# Patient Record
Sex: Female | Born: 1970 | Race: White | Hispanic: No | Marital: Single | Smoking: Former smoker
Health system: Southern US, Community
[De-identification: ages and names within clinical notes are randomized; demographics above are authoritative.]

## PROBLEM LIST (undated history)

## (undated) DIAGNOSIS — K2990 Gastroduodenitis, unspecified, without bleeding: Secondary | ICD-10-CM

## (undated) DIAGNOSIS — F32A Depression, unspecified: Secondary | ICD-10-CM

## (undated) DIAGNOSIS — B1920 Unspecified viral hepatitis C without hepatic coma: Secondary | ICD-10-CM

## (undated) DIAGNOSIS — F419 Anxiety disorder, unspecified: Secondary | ICD-10-CM

## (undated) DIAGNOSIS — F319 Bipolar disorder, unspecified: Secondary | ICD-10-CM

## (undated) DIAGNOSIS — F329 Major depressive disorder, single episode, unspecified: Secondary | ICD-10-CM

## (undated) DIAGNOSIS — F909 Attention-deficit hyperactivity disorder, unspecified type: Secondary | ICD-10-CM

## (undated) HISTORY — PX: CYST REMOVAL HAND: SHX6279

## (undated) HISTORY — PX: BREAST BIOPSY: SHX20

---

## 2001-06-10 ENCOUNTER — Inpatient Hospital Stay (HOSPITAL_COMMUNITY): Admission: EM | Admit: 2001-06-10 | Discharge: 2001-06-15 | Payer: Self-pay | Admitting: Psychiatry

## 2002-02-18 ENCOUNTER — Encounter: Admission: RE | Admit: 2002-02-18 | Discharge: 2002-02-18 | Payer: Self-pay | Admitting: Internal Medicine

## 2002-02-22 ENCOUNTER — Encounter: Admission: RE | Admit: 2002-02-22 | Discharge: 2002-02-22 | Payer: Self-pay | Admitting: Internal Medicine

## 2004-08-13 ENCOUNTER — Encounter: Admission: RE | Admit: 2004-08-13 | Discharge: 2004-08-13 | Payer: Self-pay | Admitting: *Deleted

## 2006-02-24 ENCOUNTER — Encounter: Admission: RE | Admit: 2006-02-24 | Discharge: 2006-02-24 | Payer: Self-pay | Admitting: *Deleted

## 2014-06-19 ENCOUNTER — Encounter (HOSPITAL_COMMUNITY): Payer: Self-pay | Admitting: Vascular Surgery

## 2014-06-19 ENCOUNTER — Emergency Department (HOSPITAL_COMMUNITY)
Admission: EM | Admit: 2014-06-19 | Discharge: 2014-06-19 | Disposition: A | Discharge status: Home / Self Care | Payer: Medicare Other | Attending: Emergency Medicine | Admitting: Emergency Medicine

## 2014-06-19 DIAGNOSIS — Z8619 Personal history of other infectious and parasitic diseases: Secondary | ICD-10-CM | POA: Insufficient documentation

## 2014-06-19 DIAGNOSIS — M542 Cervicalgia: Secondary | ICD-10-CM | POA: Insufficient documentation

## 2014-06-19 DIAGNOSIS — Z8719 Personal history of other diseases of the digestive system: Secondary | ICD-10-CM | POA: Diagnosis not present

## 2014-06-19 DIAGNOSIS — R079 Chest pain, unspecified: Secondary | ICD-10-CM | POA: Insufficient documentation

## 2014-06-19 DIAGNOSIS — Z8659 Personal history of other mental and behavioral disorders: Secondary | ICD-10-CM | POA: Insufficient documentation

## 2014-06-19 DIAGNOSIS — M79621 Pain in right upper arm: Secondary | ICD-10-CM | POA: Insufficient documentation

## 2014-06-19 DIAGNOSIS — Z87891 Personal history of nicotine dependence: Secondary | ICD-10-CM | POA: Diagnosis not present

## 2014-06-19 DIAGNOSIS — M79602 Pain in left arm: Secondary | ICD-10-CM

## 2014-06-19 HISTORY — DX: Gastroduodenitis, unspecified, without bleeding: K29.90

## 2014-06-19 HISTORY — DX: Depression, unspecified: F32.A

## 2014-06-19 HISTORY — DX: Attention-deficit hyperactivity disorder, unspecified type: F90.9

## 2014-06-19 HISTORY — DX: Unspecified viral hepatitis C without hepatic coma: B19.20

## 2014-06-19 HISTORY — DX: Bipolar disorder, unspecified: F31.9

## 2014-06-19 HISTORY — DX: Anxiety disorder, unspecified: F41.9

## 2014-06-19 HISTORY — DX: Major depressive disorder, single episode, unspecified: F32.9

## 2014-06-19 LAB — I-STAT TROPONIN, ED: Troponin i, poc: 0 ng/mL (ref 0.00–0.08)

## 2014-06-19 LAB — BASIC METABOLIC PANEL
Anion gap: 7 (ref 5–15)
BUN: 7 mg/dL (ref 6–23)
CO2: 27 mmol/L (ref 19–32)
CREATININE: 0.85 mg/dL (ref 0.50–1.10)
Calcium: 9 mg/dL (ref 8.4–10.5)
Chloride: 103 mmol/L (ref 96–112)
GFR calc Af Amer: >90 mL/min (ref >90)
GFR, EST NON AFRICAN AMERICAN: 83 mL/min — AB (ref >90)
GLUCOSE: 125 mg/dL — AB (ref 70–99)
POTASSIUM: 3.4 mmol/L — AB (ref 3.5–5.1)
Sodium: 137 mmol/L (ref 135–145)

## 2014-06-19 LAB — CBC
HEMATOCRIT: 39.9 % (ref 36.0–46.0)
HEMOGLOBIN: 13.6 g/dL (ref 12.0–15.0)
MCH: 31 pg (ref 26.0–34.0)
MCHC: 34.1 g/dL (ref 30.0–36.0)
MCV: 90.9 fL (ref 78.0–100.0)
Platelets: 507 10*3/uL — ABNORMAL HIGH (ref 150–400)
RBC: 4.39 MIL/uL (ref 3.87–5.11)
RDW: 13 % (ref 11.5–15.5)
WBC: 8.7 10*3/uL (ref 4.0–10.5)

## 2014-06-19 MED ORDER — HYDROCODONE-ACETAMINOPHEN 5-325 MG PO TABS: 2.0000 | ORAL_TABLET | ORAL | Status: AC | PRN

## 2014-06-19 MED ORDER — MORPHINE SULFATE 4 MG/ML IJ SOLN
4.0000 mg | Freq: Once | INTRAMUSCULAR | Status: AC
  Administered 2014-06-19: 4 mg via INTRAVENOUS
  Filled 2014-06-19: qty 1

## 2014-06-19 NOTE — ED Notes (Addendum)
Pt reports to the ED for eval of left arm pain. The pain has began on Wednesday and has been getting worse. She went to [facility] and was referred here for cardiac rule. The pain was radiating up into her left arm, shoulder, and neck. Arm is not tender to palpation. Denies any active CP. Also denies any SOB. Movement does not make the pain worse. No relieving factors. Has tried OTC meds and icing. Pt NSR on the monitor. Also the patient reports she had a significant increase in her Seroquel to 400 mg. CMS and full ROM intact. She describes the pain as a tightness. She received 324 of ASA and 1 nitro PTA. Pt A&Ox4, resp e/u, and skin warm and dry.

## 2014-06-19 NOTE — ED Provider Notes (Signed)
Patient seen/examined in the Emergency Department in conjunction with Resident Physician Provider Proulx Patient reports left arm pain and tingling in left hand.  Denies weakness and denies hand discoloration.  She mentions left sided neck pain that just started Exam : awake/alert, maex4, Equal power (5/5) with hand grip, wrist flex/extension, elbow flex/extension, and equal power with shoulder abduction/adduction.  No focal sensory deficit to light touch is noted in either UE.   Equal (2+) biceps/brachioradialisreflex in bilateral UE Plan: possible cervical radiculopathy without weakness.  Advised outpatient followup   Ripley Fraise, MD 06/19/14 [phone]

## 2014-06-19 NOTE — ED Provider Notes (Signed)
CSN: [id number]     Arrival date & time 06/19/14  1744 History   First MD Initiated Contact with Patient 06/19/14 1812     Chief Complaint  Patient presents with  . Arm Pain     (Consider location/radiation/quality/duration/timing/severity/associated sxs/prior Treatment) Patient is a 44 y.o. female presenting with arm pain. The history is provided by the patient.  Arm Pain This is a new problem. The current episode started in the past 7 days. The problem occurs constantly. The problem has been gradually worsening. Associated symptoms include chest pain (mild left upper chest with palpation.  no pain w/o palpation), myalgias and neck pain. Pertinent negatives include no abdominal pain, arthralgias, chills, coughing, fatigue, fever, nausea, numbness, rash, vomiting or weakness. Nothing aggravates the symptoms. She has tried relaxation for the symptoms. The treatment provided no relief.    Past Medical History  Diagnosis Date  . Anxiety   . ADHD (attention deficit hyperactivity disorder)   . Bipolar 1 disorder   . Depressive disorder   . Viral hepatitis C   . Gastroduodenitis    Past Surgical History  Procedure Laterality Date  . Cyst removal hand    . Breast biopsy Left    History reviewed. No pertinent family history. History  Substance Use Topics  . Smoking status: Former Smoker    Types: Cigarettes  . Smokeless tobacco: Never Used  . Alcohol Use: No   OB History    No data available     Review of Systems  Constitutional: Negative for fever, chills and fatigue.  HENT: Negative for nosebleeds.   Eyes: Negative for visual disturbance.  Respiratory: Negative for cough and shortness of breath.   Cardiovascular: Positive for chest pain (mild left upper chest with palpation.  no pain w/o palpation).  Gastrointestinal: Negative for nausea, vomiting, abdominal pain, diarrhea and constipation.  Genitourinary: Negative for dysuria.  Musculoskeletal: Positive for myalgias and  neck pain. Negative for arthralgias.  Skin: Negative for rash.  Neurological: Negative for weakness and numbness.  All other systems reviewed and are negative.     Allergies  Review of patient's allergies indicates not on file.  Home Medications   Prior to Admission medications   Not on File   BP 125/85 mmHg  Pulse 102  Temp(Src) 98.3 F (36.8 C) (Oral)  Resp 18  Ht 5\' 4"  (1.626 m)  Wt 181 lb (82.101 kg)  BMI 31.05 kg/m2  SpO2 100% Physical Exam  Constitutional: She is oriented to person, place, and time. No distress.  HENT:  Head: Normocephalic and atraumatic.  Eyes: EOM are normal. Pupils are equal, round, and reactive to light.  Neck: Normal range of motion. Neck supple.  Cardiovascular: Normal rate and intact distal pulses.   Pulmonary/Chest: No respiratory distress.  Abdominal: Soft. There is no tenderness.  Musculoskeletal: Normal range of motion.  Normal strength in the bilateral upper ext.  Normal sensation in the left hand.  The left hand is warm.  The pain in the left arm is reproducible to palpation but is very mild.  Neurological: She is alert and oriented to person, place, and time.  Skin: No rash noted. She is not diaphoretic.  Psychiatric: She has a normal mood and affect.   Cranial Nerves  II:  Visual fields Intact, pupillary equal round and reactive to light III, IV, VI: Full eye movement without nystagmus  V Facial Sensation: Normal. No weakness of masticatory muscles  VII: No facial weakness or asymmetry  VIII Auditory Acuity:  Grossly normal  IX/X: The uvula is midline; the palate elevates symmetrically  XI: Normal sternocleidomastoid and trapezius strength  XII: The tongue is midline. No atrophy or fasciculations.  Motor System: Muscle Strength: 5/5 and symmetric in the upper and lower extremities. No pronation or drift.  Muscle Tone: Tone and muscle bulk are normal in the upper and lower extremities.  Reflexes: DTRs: 2+ and symmetrical in all  four extremities.  Coordination: Intact finger-to-nose Sensation: Intact to light touch.  Gait: Routine and tandem gait are normal     ED Course  Procedures (including critical care time) Labs Review Labs Reviewed  Ruth, ED    Imaging Review No results found.   EKG Interpretation None      MDM   Final diagnoses:  None    44 y/o F w/ mult psychiatric dx but no other sig PMH presents with a few days of L upper extremity pain which has moved up the arm to the shoulder.  She has difficulty describing the pain and there are no allevating factors.  I am able to reproduce the pain on exam.  She also notes a little mild L upper chest pain that she only has with palpation and which she describes as muscle soreness.  If you don't push on the chest, she has no pain.  Exam as above, she has a borderline HR at 99.  She is aferbile, good bp, satting well on room air.  There is no chest pain or shortness of breath at rest.  Doubt PE.  She is also satting 100% on RA. Doubt ACS given no ACS risk factors, and that this would be a very atypical presentation.  Did obtain EKG which showed no st segment changes or t-wave inversions.  One trop obtained per nurse driven protocol was undectectable.  Doubt ACS  There seems to be no evidence of infection in the left arm, no warmth, no fullness, no swelling.  No concern for DVT.  Patient does endorse some lateral neck pain, but no midline neck pain, no injuries.  Possibly she has radiculopathy but she has normal strength throughout the left arm so doubt any sig spinal cord compression and feel no need for imaging at this time.  Patient has a strong left radial pulse, the hand is warm, doubt ischemia.  At this time, feels safe for opt follow up.  I have discussed the results, Dx and Tx plan with the patient. They expressed understanding and agree with the plan and were told to return to ED with any worsening of  condition or concern.    Disposition: Discharge  Condition: Good  Discharge Medication List as of 06/19/2014  8:02 PM    START taking these medications   Details  HYDROcodone-acetaminophen (NORCO/VICODIN) 5-325 MG per tablet Take 2 tablets by mouth every 4 (four) hours as needed., Starting 06/19/2014, Until Discontinued, Print        Follow Up: [facility] [address] [id number] [place] [address] [phone]  If symptoms worsen   Pt seen in conjunction with Dr. Prescott Gum, MD 06/20/14 0626  Ripley Fraise, MD 06/20/14 2231

## 2014-06-19 NOTE — Discharge Instructions (Signed)

## 2014-11-11 ENCOUNTER — Emergency Department (HOSPITAL_COMMUNITY)
Admission: EM | Admit: 2014-11-11 | Discharge: 2014-11-11 | Disposition: A | Discharge status: Home / Self Care | Payer: Medicare Other | Attending: Emergency Medicine | Admitting: Emergency Medicine

## 2014-11-11 ENCOUNTER — Encounter (HOSPITAL_COMMUNITY): Payer: Self-pay | Admitting: Emergency Medicine

## 2014-11-11 DIAGNOSIS — Z87891 Personal history of nicotine dependence: Secondary | ICD-10-CM | POA: Insufficient documentation

## 2014-11-11 DIAGNOSIS — R110 Nausea: Secondary | ICD-10-CM | POA: Diagnosis not present

## 2014-11-11 DIAGNOSIS — Z8659 Personal history of other mental and behavioral disorders: Secondary | ICD-10-CM | POA: Diagnosis not present

## 2014-11-11 DIAGNOSIS — R42 Dizziness and giddiness: Secondary | ICD-10-CM | POA: Insufficient documentation

## 2014-11-11 LAB — CBC WITH DIFFERENTIAL/PLATELET
BASOS PCT: 0 % (ref 0–1)
Basophils Absolute: 0 10*3/uL (ref 0.0–0.1)
Eosinophils Absolute: 0.1 10*3/uL (ref 0.0–0.7)
Eosinophils Relative: 2 % (ref 0–5)
HEMATOCRIT: 41.9 % (ref 36.0–46.0)
HEMOGLOBIN: 14 g/dL (ref 12.0–15.0)
LYMPHS PCT: 41 % (ref 12–46)
Lymphs Abs: 2.8 10*3/uL (ref 0.7–4.0)
MCH: 31.2 pg (ref 26.0–34.0)
MCHC: 33.4 g/dL (ref 30.0–36.0)
MCV: 93.3 fL (ref 78.0–100.0)
Monocytes Absolute: 0.5 10*3/uL (ref 0.1–1.0)
Monocytes Relative: 8 % (ref 3–12)
NEUTROS ABS: 3.4 10*3/uL (ref 1.7–7.7)
Neutrophils Relative %: 50 % (ref 43–77)
Platelets: 430 10*3/uL — ABNORMAL HIGH (ref 150–400)
RBC: 4.49 MIL/uL (ref 3.87–5.11)
RDW: 12.8 % (ref 11.5–15.5)
WBC: 6.9 10*3/uL (ref 4.0–10.5)

## 2014-11-11 LAB — URINALYSIS, ROUTINE W REFLEX MICROSCOPIC
Bilirubin Urine: NEGATIVE
Glucose, UA: NEGATIVE mg/dL
HGB URINE DIPSTICK: NEGATIVE
Ketones, ur: NEGATIVE mg/dL
Nitrite: NEGATIVE
PH: 7 (ref 5.0–8.0)
PROTEIN: NEGATIVE mg/dL
Specific Gravity, Urine: 1.013 (ref 1.005–1.030)
UROBILINOGEN UA: 1 mg/dL (ref 0.0–1.0)

## 2014-11-11 LAB — COMPREHENSIVE METABOLIC PANEL
ALT: 62 U/L — ABNORMAL HIGH (ref 14–54)
AST: 40 U/L (ref 15–41)
Albumin: 3.7 g/dL (ref 3.5–5.0)
Alkaline Phosphatase: 64 U/L (ref 38–126)
Anion gap: 7 (ref 5–15)
BUN: 7 mg/dL (ref 6–20)
CO2: 25 mmol/L (ref 22–32)
CREATININE: 0.93 mg/dL (ref 0.44–1.00)
Calcium: 8.6 mg/dL — ABNORMAL LOW (ref 8.9–10.3)
Chloride: 102 mmol/L (ref 101–111)
GFR calc Af Amer: >60 mL/min (ref >60)
GFR calc non Af Amer: >60 mL/min (ref >60)
GLUCOSE: 148 mg/dL — AB (ref 65–99)
POTASSIUM: 4.1 mmol/L (ref 3.5–5.1)
SODIUM: 134 mmol/L — AB (ref 135–145)
TOTAL PROTEIN: 7.2 g/dL (ref 6.5–8.1)
Total Bilirubin: 0.2 mg/dL — ABNORMAL LOW (ref 0.3–1.2)

## 2014-11-11 LAB — URINE MICROSCOPIC-ADD ON

## 2014-11-11 LAB — LIPASE, BLOOD: LIPASE: 18 U/L — AB (ref 22–51)

## 2014-11-11 LAB — CARBAMAZEPINE LEVEL, TOTAL: Carbamazepine Lvl: 11.5 ug/mL (ref 4.0–12.0)

## 2014-11-11 MED ORDER — LORAZEPAM 2 MG/ML IJ SOLN
1.0000 mg | Freq: Once | INTRAMUSCULAR | Status: AC
  Administered 2014-11-11: 1 mg via INTRAVENOUS
  Filled 2014-11-11: qty 1

## 2014-11-11 MED ORDER — ONDANSETRON HCL 4 MG/2ML IJ SOLN
4.0000 mg | Freq: Once | INTRAMUSCULAR | Status: AC
  Administered 2014-11-11: 4 mg via INTRAVENOUS
  Filled 2014-11-11: qty 2

## 2014-11-11 MED ORDER — SODIUM CHLORIDE 0.9 % IV BOLUS (SEPSIS)
1000.0000 mL | Freq: Once | INTRAVENOUS | Status: AC
Start: 2014-11-11 — End: 2014-11-11
  Administered 2014-11-11: 1000 mL via INTRAVENOUS

## 2014-11-11 NOTE — ED Notes (Signed)
Pt sts had increase in one of her meds and woke up this am with nausea, dizziness and slurred speech; speech noted to be clear at present; pt sts took meds last night

## 2014-11-11 NOTE — ED Provider Notes (Signed)
CSN: [id number]     Arrival date & time 11/11/14  1126 History   First MD Initiated Contact with Patient 11/11/14 1214     Chief Complaint  Patient presents with  . Nausea  . Dizziness     (Consider location/radiation/quality/duration/timing/severity/associated sxs/prior Treatment) HPI Comments: Patient presents to the emergency department with chief complaints of dizziness, nausea, vomiting, confusion, and some slurred speech. She states that the symptoms started last night and have persisted until today. She states that she takes medication for anxiety, ADHD, bipolar, and headaches. She states that she has taken her medications as directed. She has not taken anything additional. She states that she took 600 mg of extended release carbamazepine last night as well as 400 mg of immediate release carbamazepine. 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 She denies any mechanism of injury. There are no aggravating or alleviating factors.  The history is provided by the patient. No language interpreter was used.    Past Medical History  Diagnosis Date  . Anxiety   . ADHD (attention deficit hyperactivity disorder)   . Bipolar 1 disorder   . Depressive disorder   . Viral hepatitis C   . Gastroduodenitis    Past Surgical History  Procedure Laterality Date  . Cyst removal hand    . Breast biopsy Left    History reviewed. No pertinent family history. History  Substance Use Topics  . Smoking status: Former Smoker    Types: Cigarettes  . Smokeless tobacco: Never Used  . Alcohol Use: No   OB History    No data available     Review of Systems  Constitutional: Negative for fever and chills.       Intermittent confusion  Respiratory: Negative for shortness of breath.   Cardiovascular: Negative for chest pain.  Gastrointestinal: Negative for nausea, vomiting, diarrhea and constipation.  Genitourinary: Negative for dysuria.  Neurological:  Positive for dizziness.  All other systems reviewed and are negative.     Allergies  Review of patient's allergies indicates no known allergies.  Home Medications   Prior to Admission medications   Medication Sig Start Date End Date Taking? Authorizing Provider  HYDROcodone-acetaminophen (NORCO/VICODIN) 5-325 MG per tablet Take 2 tablets by mouth every 4 (four) hours as needed. 06/19/14   [name], MD   BP 143/93 mmHg  Pulse 117  Temp(Src) 97.3 F (36.3 C) (Oral)  Resp 18  SpO2 98% Physical Exam  Constitutional: She is oriented to person, place, and time. She appears well-developed and well-nourished.  HENT:  Head: Normocephalic and atraumatic.  Eyes: Conjunctivae and EOM are normal. Pupils are equal, round, and reactive to light.  Neck: Normal range of motion. Neck supple.  Cardiovascular: Normal rate and regular rhythm.  Exam reveals no gallop and no friction rub.   No murmur heard. Pulmonary/Chest: Effort normal and breath sounds normal. No respiratory distress. She has no wheezes. She has no rales. She exhibits no tenderness.  Abdominal: Soft. Bowel sounds are normal. She exhibits no distension and no mass. There is no tenderness. There is no rebound and no guarding.  Musculoskeletal: Normal range of motion. She exhibits no edema or tenderness.  Neurological: She is alert and oriented to person, place, and time.  CN III-12 intact, normal sensation and strength throughout, speech is clear, movements are goal oriented  Skin: Skin is warm and dry.  Psychiatric: She has a normal mood and affect. Her behavior is normal. Judgment  and thought content normal.  Nursing note and vitals reviewed.   ED Course  Procedures (including critical care time) Labs Review Labs Reviewed  LIPASE, BLOOD - Abnormal; Notable for the following:    Lipase 18 (*)    All other components within normal limits  COMPREHENSIVE METABOLIC PANEL - Abnormal; Notable for the following:    Sodium 134  (*)    Glucose, Bld 148 (*)    Calcium 8.6 (*)    ALT 62 (*)    Total Bilirubin 0.2 (*)    All other components within normal limits  CBC WITH DIFFERENTIAL/PLATELET - Abnormal; Notable for the following:    Platelets 430 (*)    All other components within normal limits  URINALYSIS, ROUTINE W REFLEX MICROSCOPIC (NOT AT [facility])    Imaging Review No results found.   EKG Interpretation None      MDM   Final diagnoses:  Nausea  Dizziness   Patient with symptoms consisting of nausea, vomiting, dizziness, and some confusion. I'm mainly concerned about carbamazepine toxicity, and will check a room is pain level. Differential also consists of vertigo, overdose, or less likely central vertigo.  Patient states that she is feeling much better.  Labs are reassuring.  Patient requesting to be discharged.  Patient will follow-up with her doctor today.  She is stable and ready for discharge.    Montine Circle, PA-C 11/11/14 [place], MD 11/12/14 [phone]

## 2014-11-11 NOTE — ED Notes (Signed)
PA at bedside.

## 2014-11-11 NOTE — Discharge Instructions (Signed)
Dizziness °Dizziness is a common problem. It is a feeling of unsteadiness or light-headedness. You may feel like you are about to faint. Dizziness can lead to injury if you stumble or fall. A person of any age group can suffer from dizziness, but dizziness is more common in older adults. °CAUSES  °Dizziness can be caused by many different things, including: °· Middle ear problems. °· Standing for too long. °· Infections. °· An allergic reaction. °· Aging. °· An emotional response to something, such as the sight of blood. °· Side effects of medicines. °· Tiredness. °· Problems with circulation or blood pressure. °· Excessive use of alcohol or medicines, or illegal drug use. °· Breathing too fast (hyperventilation). °· An irregular heart rhythm (arrhythmia). °· A low red blood cell count (anemia). °· Pregnancy. °· Vomiting, diarrhea, fever, or other illnesses that cause body fluid loss (dehydration). °· Diseases or conditions such as Parkinson's disease, high blood pressure (hypertension), diabetes, and thyroid problems. °· Exposure to extreme heat. °DIAGNOSIS  °Your health care provider will ask about your symptoms, perform a physical exam, and perform an electrocardiogram (ECG) to record the electrical activity of your heart. Your health care provider may also perform other heart or blood tests to determine the cause of your dizziness. These may include: °· Transthoracic echocardiogram (TTE). During echocardiography, sound waves are used to evaluate how blood flows through your heart. °· Transesophageal echocardiogram (TEE). °· Cardiac monitoring. This allows your health care provider to monitor your heart rate and rhythm in real time. °· Holter monitor. This is a portable device that records your heartbeat and can help diagnose heart arrhythmias. It allows your health care provider to track your heart activity for several days if needed. °· Stress tests by exercise or by giving medicine that makes the heart beat  faster. °TREATMENT  °Treatment of dizziness depends on the cause of your symptoms and can vary greatly. °HOME CARE INSTRUCTIONS  °· Drink enough fluids to keep your urine clear or pale yellow. This is especially important in very hot weather. In older adults, it is also important in cold weather. °· Take your medicine exactly as directed if your dizziness is caused by medicines. When taking blood pressure medicines, it is especially important to get up slowly. °¨ Rise slowly from chairs and steady yourself until you feel okay. °¨ In the morning, first sit up on the side of the bed. When you feel okay, stand slowly while holding onto something until you know your balance is fine. °· Move your legs often if you need to stand in one place for a long time. Tighten and relax your muscles in your legs while standing. °· Have someone stay with you for 1-2 days if dizziness continues to be a problem. Do this until you feel you are well enough to stay alone. Have the person call your health care provider if he or she notices changes in you that are concerning. °· Do not drive or use heavy machinery if you feel dizzy. °· Do not drink alcohol. °SEEK IMMEDIATE MEDICAL CARE IF:  °· Your dizziness or light-headedness gets worse. °· You feel nauseous or vomit. °· You have problems talking, walking, or using your arms, hands, or legs. °· You feel weak. °· You are not thinking clearly or you have trouble forming sentences. It may take a friend or family member to notice this. °· You have chest pain, abdominal pain, shortness of breath, or sweating. °· Your vision changes. °· You notice   any bleeding.  You have side effects from medicine that seems to be getting worse rather than better. MAKE SURE YOU:   Understand these instructions.  Will watch your condition.  Will get help right away if you are not doing well or get worse. Document Released: 09/18/2000 Document Revised: 03/30/2013 Document Reviewed: 10/12/2010 [facility]  Patient Information 2015 [place], [place]. This information is not intended to replace advice given to you by your health care provider. Make sure you discuss any questions you have with your health care provider. Nausea and Vomiting Nausea is a sick feeling that often comes before throwing up (vomiting). Vomiting is a reflex where stomach contents come out of your mouth. Vomiting can cause severe loss of body fluids (dehydration). Children and elderly adults can become dehydrated quickly, especially if they also have diarrhea. Nausea and vomiting are symptoms of a condition or disease. It is important to find the cause of your symptoms. CAUSES   Direct irritation of the stomach lining. This irritation can result from increased acid production (gastroesophageal reflux disease), infection, food poisoning, taking certain medicines (such as nonsteroidal anti-inflammatory drugs), alcohol use, or tobacco use.  Signals from the brain.These signals could be caused by a headache, heat exposure, an inner ear disturbance, increased pressure in the brain from injury, infection, a tumor, or a concussion, pain, emotional stimulus, or metabolic problems.  An obstruction in the gastrointestinal tract (bowel obstruction).  Illnesses such as diabetes, hepatitis, gallbladder problems, appendicitis, kidney problems, cancer, sepsis, atypical symptoms of a heart attack, or eating disorders.  Medical treatments such as chemotherapy and radiation.  Receiving medicine that makes you sleep (general anesthetic) during surgery. DIAGNOSIS Your caregiver may ask for tests to be done if the problems do not improve after a few days. Tests may also be done if symptoms are severe or if the reason for the nausea and vomiting is not clear. Tests may include:  Urine tests.  Blood tests.  Stool tests.  Cultures (to look for evidence of infection).  X-rays or other imaging studies. Test results can help your caregiver make  decisions about treatment or the need for additional tests. TREATMENT You need to stay well hydrated. Drink frequently but in small amounts.You may wish to drink water, sports drinks, clear broth, or eat frozen ice pops or gelatin dessert to help stay hydrated.When you eat, eating slowly may help prevent nausea.There are also some antinausea medicines that may help prevent nausea. HOME CARE INSTRUCTIONS   Take all medicine as directed by your caregiver.  If you do not have an appetite, do not force yourself to eat. However, you must continue to drink fluids.  If you have an appetite, eat a normal diet unless your caregiver tells you differently.  Eat a variety of complex carbohydrates (rice, wheat, potatoes, bread), lean meats, yogurt, fruits, and vegetables.  Avoid high-fat foods because they are more difficult to digest.  Drink enough water and fluids to keep your urine clear or pale yellow.  If you are dehydrated, ask your caregiver for specific rehydration instructions. Signs of dehydration may include:  Severe thirst.  Dry lips and mouth.  Dizziness.  Dark urine.  Decreasing urine frequency and amount.  Confusion.  Rapid breathing or pulse. SEEK IMMEDIATE MEDICAL CARE IF:   You have blood or brown flecks (like coffee grounds) in your vomit.  You have black or bloody stools.  You have a severe headache or stiff neck.  You are confused.  You have severe abdominal pain.  You have chest pain or trouble breathing.  You do not urinate at least once every 8 hours.  You develop cold or clammy skin.  You continue to vomit for longer than 24 to 48 hours.  You have a fever. MAKE SURE YOU:   Understand these instructions.  Will watch your condition.  Will get help right away if you are not doing well or get worse. Document Released: 03/25/2005 Document Revised: 06/17/2011 Document Reviewed: 08/22/2010 [facility] Patient Information 2015 [place], [place]. This  information is not intended to replace advice given to you by your health care provider. Make sure you discuss any questions you have with your health care provider.

## 2015-08-26 ENCOUNTER — Emergency Department (HOSPITAL_COMMUNITY)
Admission: EM | Admit: 2015-08-26 | Discharge: 2015-08-28 | Disposition: A | Discharge status: Home / Self Care | Payer: Medicare Other | Attending: Emergency Medicine | Admitting: Emergency Medicine

## 2015-08-26 ENCOUNTER — Encounter (HOSPITAL_COMMUNITY): Payer: Self-pay | Admitting: Emergency Medicine

## 2015-08-26 DIAGNOSIS — F32A Depression, unspecified: Secondary | ICD-10-CM | POA: Diagnosis present

## 2015-08-26 DIAGNOSIS — F332 Major depressive disorder, recurrent severe without psychotic features: Secondary | ICD-10-CM | POA: Diagnosis not present

## 2015-08-26 DIAGNOSIS — I10 Essential (primary) hypertension: Secondary | ICD-10-CM

## 2015-08-26 DIAGNOSIS — F1910 Other psychoactive substance abuse, uncomplicated: Secondary | ICD-10-CM

## 2015-08-26 DIAGNOSIS — F314 Bipolar disorder, current episode depressed, severe, without psychotic features: Secondary | ICD-10-CM

## 2015-08-26 DIAGNOSIS — F909 Attention-deficit hyperactivity disorder, unspecified type: Secondary | ICD-10-CM | POA: Diagnosis not present

## 2015-08-26 DIAGNOSIS — R739 Hyperglycemia, unspecified: Secondary | ICD-10-CM | POA: Diagnosis not present

## 2015-08-26 DIAGNOSIS — R45851 Suicidal ideations: Secondary | ICD-10-CM | POA: Diagnosis not present

## 2015-08-26 DIAGNOSIS — D473 Essential (hemorrhagic) thrombocythemia: Secondary | ICD-10-CM | POA: Insufficient documentation

## 2015-08-26 DIAGNOSIS — Z87891 Personal history of nicotine dependence: Secondary | ICD-10-CM | POA: Diagnosis not present

## 2015-08-26 DIAGNOSIS — Z79899 Other long term (current) drug therapy: Secondary | ICD-10-CM | POA: Diagnosis not present

## 2015-08-26 DIAGNOSIS — D75839 Thrombocytosis, unspecified: Secondary | ICD-10-CM

## 2015-08-26 DIAGNOSIS — F319 Bipolar disorder, unspecified: Secondary | ICD-10-CM | POA: Diagnosis not present

## 2015-08-26 LAB — CBC
HCT: 42.4 % (ref 36.0–46.0)
Hemoglobin: 14.2 g/dL (ref 12.0–15.0)
MCH: 30.7 pg (ref 26.0–34.0)
MCHC: 33.5 g/dL (ref 30.0–36.0)
MCV: 91.6 fL (ref 78.0–100.0)
PLATELETS: 476 10*3/uL — AB (ref 150–400)
RBC: 4.63 MIL/uL (ref 3.87–5.11)
RDW: 12.6 % (ref 11.5–15.5)
WBC: 9.5 10*3/uL (ref 4.0–10.5)

## 2015-08-26 LAB — COMPREHENSIVE METABOLIC PANEL
ALT: 28 U/L (ref 14–54)
AST: 25 U/L (ref 15–41)
Albumin: 4.2 g/dL (ref 3.5–5.0)
Alkaline Phosphatase: 71 U/L (ref 38–126)
Anion gap: 9 (ref 5–15)
BUN: 7 mg/dL (ref 6–20)
CHLORIDE: 104 mmol/L (ref 101–111)
CO2: 24 mmol/L (ref 22–32)
Calcium: 9.4 mg/dL (ref 8.9–10.3)
Creatinine, Ser: 1.07 mg/dL — ABNORMAL HIGH (ref 0.44–1.00)
GFR calc Af Amer: >60 mL/min (ref >60)
GFR calc non Af Amer: >60 mL/min (ref >60)
Glucose, Bld: 122 mg/dL — ABNORMAL HIGH (ref 65–99)
Potassium: 3.8 mmol/L (ref 3.5–5.1)
Sodium: 137 mmol/L (ref 135–145)
Total Bilirubin: 0.3 mg/dL (ref 0.3–1.2)
Total Protein: 7.9 g/dL (ref 6.5–8.1)

## 2015-08-26 LAB — RAPID URINE DRUG SCREEN, HOSP PERFORMED
Amphetamines: POSITIVE — AB
BENZODIAZEPINES: POSITIVE — AB
Barbiturates: NOT DETECTED
Cocaine: POSITIVE — AB
OPIATES: NOT DETECTED
Tetrahydrocannabinol: POSITIVE — AB

## 2015-08-26 LAB — ACETAMINOPHEN LEVEL: Acetaminophen (Tylenol), Serum: <10 ug/mL — ABNORMAL LOW (ref 10–30)

## 2015-08-26 LAB — ETHANOL

## 2015-08-26 LAB — SALICYLATE LEVEL: Salicylate Lvl: <4 mg/dL (ref 2.8–30.0)

## 2015-08-26 MED ORDER — PANTOPRAZOLE SODIUM 40 MG PO TBEC
80.0000 mg | DELAYED_RELEASE_TABLET | Freq: Every day | ORAL | Status: DC
  Administered 2015-08-27 – 2015-08-28 (×2): 80 mg via ORAL
  Filled 2015-08-26 (×2): qty 2

## 2015-08-26 MED ORDER — ALPRAZOLAM 1 MG PO TABS
1.0000 mg | ORAL_TABLET | Freq: Four times a day (QID) | ORAL | Status: DC
  Administered 2015-08-27 (×4): 1 mg via ORAL
  Filled 2015-08-26 (×4): qty 1

## 2015-08-26 MED ORDER — LAMOTRIGINE 100 MG PO TABS
100.0000 mg | ORAL_TABLET | Freq: Every day | ORAL | Status: DC
  Administered 2015-08-26 – 2015-08-27 (×2): 100 mg via ORAL
  Filled 2015-08-26 (×3): qty 1

## 2015-08-26 MED ORDER — IBUPROFEN 200 MG PO TABS
600.0000 mg | ORAL_TABLET | Freq: Three times a day (TID) | ORAL | Status: DC | PRN
  Administered 2015-08-27: 600 mg via ORAL
  Filled 2015-08-26: qty 3

## 2015-08-26 MED ORDER — METHYLPHENIDATE HCL 5 MG PO TABS
20.0000 mg | ORAL_TABLET | Freq: Four times a day (QID) | ORAL | Status: DC
  Administered 2015-08-27 (×4): 20 mg via ORAL
  Filled 2015-08-26 (×4): qty 4

## 2015-08-26 MED ORDER — ZOLPIDEM TARTRATE 5 MG PO TABS
5.0000 mg | ORAL_TABLET | Freq: Every evening | ORAL | Status: DC | PRN
  Administered 2015-08-26 – 2015-08-27 (×2): 5 mg via ORAL
  Filled 2015-08-26 (×2): qty 1

## 2015-08-26 MED ORDER — CLONIDINE HCL 0.1 MG PO TABS
0.2000 mg | ORAL_TABLET | Freq: Every day | ORAL | Status: DC
  Administered 2015-08-26 – 2015-08-27 (×2): 0.2 mg via ORAL
  Filled 2015-08-26 (×2): qty 2

## 2015-08-26 MED ORDER — ONDANSETRON HCL 4 MG PO TABS: 4.0000 mg | ORAL_TABLET | Freq: Three times a day (TID) | ORAL | Status: DC | PRN

## 2015-08-26 MED ORDER — LAMOTRIGINE 200 MG PO TABS
200.0000 mg | ORAL_TABLET | Freq: Every day | ORAL | Status: DC
  Administered 2015-08-27 – 2015-08-28 (×2): 200 mg via ORAL
  Filled 2015-08-26 (×2): qty 1

## 2015-08-26 MED ORDER — ACETAMINOPHEN 325 MG PO TABS: 650.0000 mg | ORAL_TABLET | ORAL | Status: DC | PRN

## 2015-08-26 MED ORDER — ALUM & MAG HYDROXIDE-SIMETH 200-200-20 MG/5ML PO SUSP: 30.0000 mL | ORAL | Status: DC | PRN

## 2015-08-26 MED ORDER — LURASIDONE HCL 40 MG PO TABS
120.0000 mg | ORAL_TABLET | Freq: Every day | ORAL | Status: DC
  Administered 2015-08-26 – 2015-08-27 (×2): 120 mg via ORAL
  Filled 2015-08-26 (×3): qty 3
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
## 2015-08-26 NOTE — ED Notes (Signed)
Pt reports SI. No specific plan at present. Hx of SI attempts. No HI, hallucinations or substance abuse.

## 2015-08-26 NOTE — ED Provider Notes (Signed)
CSN: [id number]     Arrival date & time 08/26/15  1901 History   First MD Initiated Contact with Patient 08/26/15 2103     Chief Complaint  Patient presents with  . Suicidal     (Consider location/radiation/quality/duration/timing/severity/associated sxs/prior Treatment) HPI Comments: Tara Gentry is a 45 y.o. female with a PMHx of anxiety, ADHD, bipolar 1 disorder, depression, viral HepC, and gastroduodenitis, who presents to the ED voluntarily upon her psychiatrist's recommendation, with complaints of SI with a plan to OD. She states this has been ongoing for approximately 2 months. Her psychiatrist increased her Lamictal today, but due to her suicidal ideations she advised her to seek emergent psychiatric attention. Patient is here voluntarily. She denies HI/AVH, smoking, or any alcohol use. She reports very infrequent marijuana use, last use was 3 weeks ago, and denies any other illicit drug use. She is compliant with all her psychiatric medications, last dose of her afternoon medicines was at around 2 PM. She has not had her dinnertime medications he had. Her psychiatrist is Dr. Robina Ade. She has no medical complaints today and denies any other concerns at this time. Denies possibility of pregnancy, states she's a lesbian and is also on OCPs.  Patient is a 45 y.o. female presenting with mental health disorder. The history is provided by the patient. No language interpreter was used.  Mental Health Problem Presenting symptoms: depression and suicidal thoughts   Presenting symptoms: no hallucinations and no homicidal ideas   Onset quality:  Gradual Duration:  2 months Timing:  Constant Progression:  Worsening Chronicity:  Recurrent Context: recent medication change   Treatment compliance:  All of the time Time since last psychoactive medication taken:  7 hours Relieved by:  None tried Worsened by:  Nothing tried Ineffective treatments:  None tried Associated symptoms: no abdominal pain and  no chest pain   Risk factors: hx of mental illness and hx of suicide attempts     Past Medical History  Diagnosis Date  . Anxiety   . ADHD (attention deficit hyperactivity disorder)   . Bipolar 1 disorder ([place])   . Depressive disorder   . Viral hepatitis C   . Gastroduodenitis    Past Surgical History  Procedure Laterality Date  . Cyst removal hand    . Breast biopsy Left    History reviewed. No pertinent family history. Social History  Substance Use Topics  . Smoking status: Former Smoker    Types: Cigarettes  . Smokeless tobacco: Never Used  . Alcohol Use: No   OB History    No data available     Review of Systems  Constitutional: Negative for fever and chills.  Respiratory: Negative for shortness of breath.   Cardiovascular: Negative for chest pain.  Gastrointestinal: Negative for nausea, vomiting, abdominal pain, diarrhea and constipation.  Genitourinary: Negative for dysuria and hematuria.  Musculoskeletal: Negative for myalgias and arthralgias.  Skin: Negative for color change.  Allergic/Immunologic: Negative for immunocompromised state.  Neurological: Negative for weakness and numbness.  Psychiatric/Behavioral: Positive for suicidal ideas. Negative for homicidal ideas, hallucinations and confusion.   10 Systems reviewed and are negative for acute change except as noted in the HPI.    Allergies  Review of patient's allergies indicates no known allergies.  Home Medications   Prior to Admission medications   Medication Sig Start Date End Date Taking? Authorizing Provider  ALPRAZolam Duanne Moron) 1 MG tablet Take 1 mg by mouth 4 (four) times daily.    Yes Historical Provider,  MD  cloNIDine (CATAPRES) 0.1 MG tablet Take 0.2 mg by mouth at bedtime.   Yes Historical Provider, MD  lamoTRIgine (LAMICTAL) 100 MG tablet Take 100-200 mg by mouth 2 (two) times daily. 200 mg every morning and 100 mg every night 07/25/09  Yes Historical Provider, MD  Lurasidone HCl (LATUDA)  120 MG TABS Take 120 mg by mouth daily.   Yes Historical Provider, MD  methylphenidate (RITALIN) 20 MG tablet Take 20 mg by mouth 4 (four) times daily.   Yes Historical Provider, MD  norgestimate-ethinyl estradiol (MONONESSA) 0.25-35 MG-MCG tablet Take 1 tablet by mouth daily.   Yes Historical Provider, MD  omeprazole (PRILOSEC) 20 MG capsule Take 40 mg by mouth daily.   Yes Historical Provider, MD  HYDROcodone-acetaminophen (NORCO/VICODIN) 5-325 MG per tablet Take 2 tablets by mouth every 4 (four) hours as needed. Patient not taking: Reported on 11/11/2014 06/19/14   [name], MD   BP 152/117 mmHg  Pulse 108  Temp(Src) 98.1 F (36.7 C) (Oral)  Resp 16  SpO2 98% Physical Exam  Constitutional: She is oriented to person, place, and time. Vital signs are normal. She appears well-developed and well-nourished.  Non-toxic appearance. No distress.  Afebrile, nontoxic, NAD, clutching a small stuffed animal and tearful at times  HENT:  Head: Normocephalic and atraumatic.  Mouth/Throat: Oropharynx is clear and moist and mucous membranes are normal.  Eyes: Conjunctivae and EOM are normal. Right eye exhibits no discharge. Left eye exhibits no discharge.  Neck: Normal range of motion. Neck supple.  Cardiovascular: Normal rate, regular rhythm, normal heart sounds and intact distal pulses.  Exam reveals no gallop and no friction rub.   No murmur heard. Tachycardia initially which resolved by my exam  Pulmonary/Chest: Effort normal and breath sounds normal. No respiratory distress. She has no decreased breath sounds. She has no wheezes. She has no rhonchi. She has no rales.  Abdominal: Soft. Normal appearance and bowel sounds are normal. She exhibits no distension. There is no tenderness. There is no rigidity, no rebound, no guarding, no CVA tenderness, no tenderness at McBurney's point and negative Murphy's sign.  Musculoskeletal: Normal range of motion.  Neurological: She is alert and oriented to  person, place, and time. She has normal strength. No sensory deficit.  Skin: Skin is warm, dry and intact. No rash noted.  Psychiatric: Her speech is normal. She is not actively hallucinating. She exhibits a depressed mood. She expresses suicidal ideation. She expresses no homicidal ideation. She expresses suicidal plans. She expresses no homicidal plans.  Clutching a small stuffed animal, depressed affect, tearful at times, endorsing SI with a plan to OD, denies HI/AVH  Nursing note and vitals reviewed.   ED Course  Procedures (including critical care time) Labs Review Labs Reviewed  COMPREHENSIVE METABOLIC PANEL - Abnormal; Notable for the following:    Glucose, Bld 122 (*)    Creatinine, Ser 1.07 (*)    All other components within normal limits  ACETAMINOPHEN LEVEL - Abnormal; Notable for the following:    Acetaminophen (Tylenol), Serum <10 (*)    All other components within normal limits  CBC - Abnormal; Notable for the following:    Platelets 476 (*)    All other components within normal limits  ETHANOL  SALICYLATE LEVEL  URINE RAPID DRUG SCREEN, HOSP PERFORMED    Imaging Review No results found. I have personally reviewed and evaluated these images and lab results as part of my medical decision-making.   EKG Interpretation None  MDM   Final diagnoses:  Suicidal ideation  HTN (hypertension), benign  Thrombocytosis ([place])  Borderline hyperglycemia    45 y.o. female here with suicidal ideations with a plan to OD. Hx of suicide attempts. Went to her psychiatrist who told her to come here. No HI/AVH. Rare marijuana use, no other illicit drugs. No EtOH use. Pt tearful but cooperative, here voluntarily. If she tried to leave she would need IVC papers taken out. Screening labs show chronic thrombocytosis and mild hyperglycemia similar to prior visits. UDS not yet obtained, pt states she is hungry/thirsty, will get her meal order placed so she can give [place] a urine specimen,  but she is medically cleared at this time. TTS consulted, psych hold orders placed, please see TTS notes for further documentation of care/dispo. Pt stable at this time. Home meds reordered aside from OCPs which she will get a friend to bring from home.  BP 152/117 mmHg  Pulse 108  Temp(Src) 98.1 F (36.7 C) (Oral)  Resp 16  SpO2 98%  Meds ordered this encounter  Medications  . ALPRAZolam (XANAX) tablet 1 mg    Sig:   . cloNIDine (CATAPRES) tablet 0.2 mg    Sig:   . lamoTRIgine (LAMICTAL) tablet 100-200 mg    Sig:   . Lurasidone HCl TABS 120 mg    Sig:   . methylphenidate (RITALIN) tablet 20 mg    Sig:   . pantoprazole (PROTONIX) EC tablet 80 mg    Sig:   . alum & mag hydroxide-simeth (MAALOX/MYLANTA) [id number] MG/5ML suspension 30 mL    Sig:   . ondansetron (ZOFRAN) tablet 4 mg    Sig:   . zolpidem (AMBIEN) tablet 5 mg    Sig:   . ibuprofen (ADVIL,MOTRIN) tablet 600 mg    Sig:   . acetaminophen (TYLENOL) tablet 650 mg    Sig:      Jaqwan Wieber Camprubi-Soms, PA-C 08/26/15 2125  Fredia Sorrow, MD 08/28/15 0025

## 2015-08-26 NOTE — ED Notes (Signed)
Belongings in locker 71.

## 2015-08-26 NOTE — BH Assessment (Signed)
Assessment completed. Consulted Darlyne Russian, PA-C who agrees that pt meets inpatient criteria. Informed Mercedes Camprubi-Soms, PA-C of the recommendation.

## 2015-08-27 DIAGNOSIS — R45851 Suicidal ideations: Secondary | ICD-10-CM | POA: Diagnosis not present

## 2015-08-27 DIAGNOSIS — F332 Major depressive disorder, recurrent severe without psychotic features: Secondary | ICD-10-CM | POA: Diagnosis not present

## 2015-08-27 NOTE — ED Provider Notes (Signed)
I was asked to evaluate the patient for possible IVC paperwork. The patient prefers the history that she was feeling suicidal and had thoughts of taking her medications and her life. At this time she wanted to go home. She asked me to call her psychiatrist Dr. Robina Ade who I did and I spoke to her on the phone. As it was recommended by the psychiatrist who saw the patient this morning that she stay for inpatient psychiatric help Dr. Robina Ade felt that this plan should be up held and said she could not come into the emergency department to evaluate her here today. This was relayed to the patient who was not happy with this but was cooperative. IVC paperwork was completed.  Harvel Quale, MD 08/27/15 [phone]

## 2015-08-27 NOTE — Consult Note (Signed)
[facility] Face-to-Face Psychiatry Consult   Reason for Consult:  Polysubstance intoxication, depression with suicide ideation Referring Physician:   EDP Patient Identification: Tara Gentry MRN:  [id number] Principal Diagnosis: Major depressive disorder, recurrent, severe without psychotic features [facility]) Diagnosis:   Patient Active Problem List   Diagnosis Date Noted  . Major depressive disorder, recurrent, severe without psychotic features ([place]) [F33.2] 08/27/2015    Priority: High    Total Time spent with patient: 45 minutes  Subjective:   Tara Gentry is a 45 y.o. female patient admitted with Depression, suicide ideation  HPI:  Caucasian female, 45 years old was evaluated today for suicidal ideation with plans to OD on her medications.   Patient stated when providers asked her what brought her to the hospital" Life is too much, a lot is going on and I do not want to live any more". She reports multiple previous attempts to kill  or harm herself.  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  Patient sees Dr. Toy Care, Psychiatrist who prescribes her medications.  Patient is receiving Benzodiazepine and is also using Cocaine and Marijuana.  Patient was advised by her Psychiatrist to come to the ER for evaluation.  She reports poor sleep and reports multiple previous inpatient Psychiatric hospitalizations.  She has been accepted for admission and we will be seeking placement at any facility with available bed.  Past Psychiatric History: MDD, GAD  Risk to Self: Suicidal Ideation: Yes-Currently Present Suicidal Intent: Yes-Currently Present Is patient at risk for suicide?: Yes Suicidal Plan?: Yes-Currently Present Specify Current Suicidal Plan: Overdose on medication.  Access to Means: Yes Specify Access to Suicidal Means: prescription medication. What has been your use of drugs/alcohol within the last 12 months?: Pt  reported that she smoked THC once.  How many times?:  (multiple times since the age 45. ) Other Self Harm Risks: Pt denies.  Triggers for Past Attempts: Unpredictable Intentional Self Injurious Behavior: None Risk to Others: Homicidal Ideation: No Thoughts of Harm to Others: No Current Homicidal Intent: No Current Homicidal Plan: No Access to Homicidal Means: No Identified Victim: N/A History of harm to others?: No Assessment of Violence: None Noted Violent Behavior Description: No violent behaviors observed. Pt is calm and cooperative at this time.  Does patient have access to weapons?: No Criminal Charges Pending?: No Does patient have a court date: No Prior Inpatient Therapy: Prior Inpatient Therapy: Yes Prior Therapy Dates: 1992-present  Prior Therapy Facilty/Provider(s): [facility], [facility], [facility] Reason for Treatment: Depression  Prior Outpatient Therapy: Prior Outpatient Therapy: Yes Prior Therapy Dates: 2004-present  Prior Therapy Facilty/Provider(s): Dr. Toy Care Reason for Treatment: Medication management  Does patient have an ACCT team?: No Does patient have Intensive In-House Services?  : No Does patient have [facility] services? : No Does patient have P4CC services?: No  Past Medical History:  Past Medical History  Diagnosis Date  . Anxiety   . ADHD (attention deficit hyperactivity disorder)   . Bipolar 1 disorder ([place])   . Depressive disorder   . Viral hepatitis C   . Gastroduodenitis     Past Surgical History  Procedure Laterality Date  . Cyst removal hand    . Breast biopsy Left    Family History: History reviewed. No pertinent family history.   Family Psychiatric  History: Unknown Social History:  History  Alcohol Use No     History  Drug Use  . Yes  . Special: Marijuana    Comment: last [facility] 3/11    Social History   Social History  . Marital Status: Single    Spouse Name: N/A  . Number of Children: N/A  . Years of  Education: N/A   Social History Main Topics  . Smoking status: Former Smoker    Types: Cigarettes  . Smokeless tobacco: Never Used  . Alcohol Use: No  . Drug Use: Yes    Special: Marijuana     Comment: last [facility] 3/11  . Sexual Activity: Not Asked   Other Topics Concern  . None   Social History Narrative   Additional Social History:    Allergies:  No Known Allergies  Labs:  Results for orders placed or performed during the hospital encounter of 08/26/15 (from the past 48 hour(s))  Comprehensive metabolic panel     Status: Abnormal   Collection Time: 08/26/15  8:00 PM  Result Value Ref Range   Sodium 137 135 - 145 mmol/L   Potassium 3.8 3.5 - 5.1 mmol/L   Chloride 104 101 - 111 mmol/L   CO2 24 22 - 32 mmol/L   Glucose, Bld 122 (H) 65 - 99 mg/dL   BUN 7 6 - 20 mg/dL   Creatinine, Ser 1.07 (H) 0.44 - 1.00 mg/dL   Calcium 9.4 8.9 - 10.3 mg/dL   Total Protein 7.9 6.5 - 8.1 g/dL   Albumin 4.2 3.5 - 5.0 g/dL   AST 25 15 - 41 U/L   ALT 28 14 - 54 U/L   Alkaline Phosphatase 71 38 - 126 U/L   Total Bilirubin 0.3 0.3 - 1.2 mg/dL   GFR calc non Af Amer >60 >60 mL/min   GFR calc Af Amer >60 >60 mL/min    Comment: (NOTE) The eGFR has been calculated using the CKD EPI equation. This calculation has not been validated in all clinical situations. eGFR's persistently <60 mL/min signify possible Chronic Kidney Disease.    Anion gap 9 5 - 15  Ethanol     Status: None   Collection Time: 08/26/15  8:00 PM  Result Value Ref Range   Alcohol, Ethyl (B) <5 <5 mg/dL    Comment:        LOWEST DETECTABLE LIMIT FOR SERUM ALCOHOL IS 5 mg/dL FOR MEDICAL PURPOSES ONLY   Salicylate level     Status: None   Collection Time: 08/26/15  8:00 PM  Result Value Ref Range   Salicylate Lvl <7.4 2.8 - 30.0 mg/dL  Acetaminophen level     Status: Abnormal   Collection Time: 08/26/15  8:00 PM  Result Value Ref Range   Acetaminophen (Tylenol), Serum <10 (L) 10 - 30 ug/mL    Comment:         THERAPEUTIC CONCENTRATIONS VARY SIGNIFICANTLY. A RANGE OF 10-30 ug/mL MAY BE AN EFFECTIVE CONCENTRATION FOR MANY PATIENTS. HOWEVER, SOME ARE BEST TREATED AT CONCENTRATIONS OUTSIDE THIS RANGE. ACETAMINOPHEN CONCENTRATIONS >150 ug/mL AT 4 HOURS AFTER INGESTION AND >50 ug/mL AT 12 HOURS AFTER INGESTION ARE OFTEN ASSOCIATED WITH TOXIC REACTIONS.   cbc     Status: Abnormal   Collection Time: 08/26/15  8:00 PM  Result Value Ref Range   WBC 9.5 4.0 - 10.5 K/uL   RBC 4.63 3.87 - 5.11 MIL/uL   Hemoglobin 14.2 12.0 - 15.0 g/dL   HCT 42.4 36.0 - 46.0 %   MCV 91.6 78.0 - 100.0 fL   MCH 30.7 26.0 - 34.0  pg   MCHC 33.5 30.0 - 36.0 g/dL   RDW 12.6 11.5 - 15.5 %   Platelets 476 (H) 150 - 400 K/uL  Rapid urine drug screen (hospital performed)     Status: Abnormal   Collection Time: 08/26/15  9:56 PM  Result Value Ref Range   Opiates NONE DETECTED NONE DETECTED   Cocaine POSITIVE (A) NONE DETECTED   Benzodiazepines POSITIVE (A) NONE DETECTED   Amphetamines POSITIVE (A) NONE DETECTED   Tetrahydrocannabinol POSITIVE (A) NONE DETECTED   Barbiturates NONE DETECTED NONE DETECTED    Comment:        DRUG SCREEN FOR MEDICAL PURPOSES ONLY.  IF CONFIRMATION IS NEEDED FOR ANY PURPOSE, NOTIFY LAB WITHIN 5 DAYS.        LOWEST DETECTABLE LIMITS FOR URINE DRUG SCREEN Drug Class       Cutoff (ng/mL) Amphetamine      1000 Barbiturate      200 Benzodiazepine   229 Tricyclics       798 Opiates          300 Cocaine          300 THC              50     Current Facility-Administered Medications  Medication Dose Route Frequency Provider Last Rate Last Dose  . acetaminophen (TYLENOL) tablet 650 mg  650 mg Oral Q4H PRN Mercedes Camprubi-Soms, PA-C      . ALPRAZolam (XANAX) tablet 1 mg  1 mg Oral QID Mercedes Camprubi-Soms, PA-C   1 mg at 08/27/15 1309  . alum & mag hydroxide-simeth (MAALOX/MYLANTA) 200-200-20 MG/5ML suspension 30 mL  30 mL Oral PRN Mercedes Camprubi-Soms, PA-C      . cloNIDine  (CATAPRES) tablet 0.2 mg  0.2 mg Oral QHS Mercedes Camprubi-Soms, PA-C   0.2 mg at 08/26/15 2213  . ibuprofen (ADVIL,MOTRIN) tablet 600 mg  600 mg Oral Q8H PRN Mercedes Camprubi-Soms, PA-C   600 mg at 08/27/15 0631  . lamoTRIgine (LAMICTAL) tablet 100 mg  100 mg Oral QHS Fredia Sorrow, MD   100 mg at 08/26/15 2213  . lamoTRIgine (LAMICTAL) tablet 200 mg  200 mg Oral Daily Mercedes Camprubi-Soms, PA-C   200 mg at 08/27/15 0930  . lurasidone (LATUDA) tablet 120 mg  120 mg Oral Q supper Mercedes Camprubi-Soms, PA-C   120 mg at 08/26/15 2212  . methylphenidate (RITALIN) tablet 20 mg  20 mg Oral QID Mercedes Camprubi-Soms, PA-C   20 mg at 08/27/15 1309  . ondansetron (ZOFRAN) tablet 4 mg  4 mg Oral Q8H PRN Mercedes Camprubi-Soms, PA-C      . pantoprazole (PROTONIX) EC tablet 80 mg  80 mg Oral Daily Mercedes Camprubi-Soms, PA-C   80 mg at 08/27/15 0930  . zolpidem (AMBIEN) tablet 5 mg  5 mg Oral QHS PRN Mercedes Camprubi-Soms, PA-C   5 mg at 08/26/15 2303   Current Outpatient Prescriptions  Medication Sig Dispense Refill  . ALPRAZolam (XANAX) 1 MG tablet Take 1 mg by mouth 4 (four) times daily.     . cloNIDine (CATAPRES) 0.1 MG tablet Take 0.2 mg by mouth at bedtime.    . lamoTRIgine (LAMICTAL) 100 MG tablet Take 100-200 mg by mouth 2 (two) times daily. 200 mg every morning and 100 mg every night    . Lurasidone HCl (LATUDA) 120 MG TABS Take 120 mg by mouth daily.    . methylphenidate (RITALIN) 20 MG tablet Take 20 mg by mouth 4 (four) times daily.    Marland Kitchen  norgestimate-ethinyl estradiol (MONONESSA) 0.25-35 MG-MCG tablet Take 1 tablet by mouth daily.    Marland Kitchen omeprazole (PRILOSEC) 20 MG capsule Take 40 mg by mouth daily.    Marland Kitchen HYDROcodone-acetaminophen (NORCO/VICODIN) 5-325 MG per tablet Take 2 tablets by mouth every 4 (four) hours as needed. (Patient not taking: Reported on 11/11/2014) 10 tablet 0    Musculoskeletal: Strength & Muscle Tone: within normal limits Gait & Station: normal Patient leans:  N/A  Psychiatric Specialty Exam: Physical Exam  Review of Systems  Constitutional: Negative.   HENT: Negative.   Eyes: Negative.   Respiratory: Negative.   Cardiovascular: Negative.   Gastrointestinal: Negative.   Genitourinary: Negative.   Skin: Negative.   Neurological: Negative.   Endo/Heme/Allergies: Negative.     Blood pressure 114/71, pulse 111, temperature 97.8 F (36.6 C), temperature source Oral, resp. rate 20, SpO2 92 %.There is no weight on file to calculate BMI.  General Appearance: Casual  Eye Contact:  Good  Speech:  Clear and Coherent and Normal Rate  Volume:  Normal  Mood:  Angry, Anxious and Depressed  Affect:  Congruent, Depressed, Flat and Tearful  Thought Process:  Coherent and Goal Directed  Orientation:  Full (Time, Place, and Person)  Thought Content:  WDL  Suicidal Thoughts:  Yes.  with intent/plan  Homicidal Thoughts:  No  Memory:  Immediate;   Good Recent;   Good Remote;   Good  Judgement:  Poor  Insight:  Good  Psychomotor Activity:  Psychomotor Retardation  Concentration:  Concentration: Fair  Recall:  Good  Fund of Knowledge:  Good  Language:  Good  Akathisia:  No  Handed:  Right  AIMS (if indicated):     Assets:  Desire for Improvement  ADL's:  Intact  Cognition:  WNL  Sleep:        Treatment Plan Summary: Daily contact with patient to assess and evaluate symptoms and progress in treatment and Medication management  Disposition:  Accepted for admission and we will be seeking placement at any facility with available bed.  Patient is placed on IVC for wanting to leave without receiving treatment.  Her home medications are resumed.  Delfin Gant, NP   PMHNP-BC 08/27/2015 3:31 PM  Patient seen face to face for this evaluation along with physician extender and case discussed with treatment team and formulated treatment plan. Reviewed the information documented and agree with the treatment plan.  Petrona Wyeth,JANARDHAHA  R. 08/28/2015 9:56 AM

## 2015-08-27 NOTE — ED Notes (Signed)
Patient transferred to to room 35 in psych ED.  Belongings removed from locker 33 and given to psych staff.

## 2015-08-27 NOTE — Clinical Social Work Note (Signed)
CSW faxed IVC paperwork to the magistrates office. Dede Query, LCSW   [place] Worker - Weekend Coverage cell #: [phone]

## 2015-08-27 NOTE — BH Assessment (Signed)
Per Reginold Agent, NP - patient meets criteria for inpt hosp.   TTS will follow up on the following referrals. [facility]

## 2015-08-27 NOTE — ED Notes (Signed)
Patient tearful upon assessment when asked what is wrong she stated,  "he is mean and does not think I need to be here". Writer asked patient, who is he? She stated the psychiatrist. Patient states, "she is ready to leave because she does not want to see "that doctor " again. Writer explained to patient that inpatient psych was recommended and we are waiting on a bed. Writer  also explained to patient that she would not be seeing that doctor again today  and its best if she stayed in hospital for recommended treatment.  Patient still insisting on leaving notified Reginold Agent, NP of situation.

## 2015-08-27 NOTE — Progress Notes (Signed)
Pt was transferred from TCU to [facility] with c/o suicidal ideation. Pt presents with flat affect and depressed mood. Pt A & O X4.  Denies SI, HI, AVH and pain on initial assessment. Stated to Probation officer "I have a lot going on in my life and a 44 y/o son to take care of; I was stressed and needed a break from it all". "I'm fine now and wants to go home; I have a psychiatrist and a therapy I see regularly". Pt does have a h/o of Bipolar and Generalized anxiety and polysubstance abuse (THC, Cocaine). Emotional support, availability and encouragement provided to pt. Q 15 minutes checks maintained for safety without outburst or self harm gestures to note thus far this shift.

## 2015-08-28 DIAGNOSIS — F314 Bipolar disorder, current episode depressed, severe, without psychotic features: Secondary | ICD-10-CM | POA: Diagnosis present

## 2015-08-28 DIAGNOSIS — F32A Depression, unspecified: Secondary | ICD-10-CM | POA: Diagnosis present

## 2015-08-28 DIAGNOSIS — F1910 Other psychoactive substance abuse, uncomplicated: Secondary | ICD-10-CM | POA: Diagnosis present

## 2015-08-28 NOTE — [facility] Suicide Risk Assessment (Signed)
Suicide Risk Assessment  Discharge Assessment   [facility] Discharge Suicide Risk Assessment   Principal Problem: Bipolar affective disorder, depressed, severe ([place]) Discharge Diagnoses:  Patient Active Problem List   Diagnosis Date Noted  . Bipolar affective disorder, depressed, severe ([place]) [F31.4] 08/28/2015    Priority: High  . Polysubstance abuse [F19.10] 08/28/2015    Priority: High  . Suicidal ideation [R45.851]     Total Time spent with patient: 30 minutes  Musculoskeletal: Strength & Muscle Tone: within normal limits Gait & Station: normal Patient leans: N/A  Psychiatric Specialty Exam: Physical Exam  Constitutional: She is oriented to person, place, and time. She appears well-developed and well-nourished.  HENT:  Head: Normocephalic.  Neck: Normal range of motion.  Respiratory: Effort normal.  Musculoskeletal: Normal range of motion.  Neurological: She is alert and oriented to person, place, and time.  Skin: Skin is warm and dry.    Review of Systems  Constitutional: Negative.   HENT: Negative.   Eyes: Negative.   Respiratory: Negative.   Cardiovascular: Negative.   Gastrointestinal: Negative.   Genitourinary: Negative.   Musculoskeletal: Negative.   Skin: Negative.   Neurological: Negative.   Endo/Heme/Allergies: Negative.   Psychiatric/Behavioral: Positive for depression and substance abuse.    Blood pressure 125/75, pulse 109, temperature 98.3 F (36.8 C), temperature source Oral, resp. rate 20, SpO2 97 %.There is no weight on file to calculate BMI.  General Appearance: Casual  Eye Contact:  Good  Speech:  Normal Rate  Volume:  Normal  Mood:  Depressed  Affect:  Congruent  Thought Process:  Coherent  Orientation:  Full (Time, Place, and Person)  Thought Content:  WDL  Suicidal Thoughts:  No  Homicidal Thoughts:  No  Memory:  Immediate;   Good Recent;   Good Remote;   Good  Judgement:  Fair  Insight:  Good  Psychomotor Activity:  Normal   Concentration:  Concentration: Good and Attention Span: Good  Recall:  Good  Fund of Knowledge:  Good  Language:  Good  Akathisia:  No  Handed:  Right  AIMS (if indicated):     Assets:  Housing Intimacy Leisure Time Physical Health Resilience Social Support  ADL's:  Intact  Cognition:  WNL  Sleep:       Mental Status Per Nursing Assessment::   On Admission:   Suicidal ideations  Demographic Factors:  Adolescent or young adult  Loss Factors: NA  Historical Factors: NA  Risk Reduction Factors:   Responsible for children under 35 years of age, Sense of responsibility to family, Living with another person, especially a relative, Positive social support and Positive therapeutic relationship  Continued Clinical Symptoms:  Depression, mild  Cognitive Features That Contribute To Risk:  None    Suicide Risk:  Minimal: No identifiable suicidal ideation.  Patients presenting with no risk factors but with morbid ruminations; may be classified as minimal risk based on the severity of the depressive symptoms    Plan Of Care/Follow-up recommendations:  Activity:  as tolerated  Diet:  heart healthy diet  LORD, JAMISON, NP 08/28/2015, 10:48 AM

## 2015-08-28 NOTE — Progress Notes (Signed)
Patient calm and cooperative. Denies active SI/AH. Denies pain. Verbalizes no concern. Every 15 minutes check for safety maintained. Will continue to monitor patient for safety and stability.
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
## 2015-08-28 NOTE — ED Notes (Signed)
Pt states that she is ready to go now. Denies SI/HI. Denies AVH. Does not appear to be responding to internal stimuli, is not delusional. She called a friend to come and get her. All belongings returned to patient who signed for them. She dressed in the bathroom and went to wait for her friend in the ER waiting room.

## 2019-04-27 ENCOUNTER — Other Ambulatory Visit: Payer: Self-pay | Admitting: Gynecology

## 2019-04-27 DIAGNOSIS — N63 Unspecified lump in unspecified breast: Secondary | ICD-10-CM

## 2019-05-10 ENCOUNTER — Other Ambulatory Visit: Payer: Medicare Other

## 2019-05-14 ENCOUNTER — Ambulatory Visit
Admission: RE | Admit: 2019-05-14 | Discharge: 2019-05-14 | Disposition: A | Discharge status: Home / Self Care | Payer: Medicare Other | Source: Ambulatory Visit | Attending: Gynecology | Admitting: Gynecology

## 2019-05-14 ENCOUNTER — Other Ambulatory Visit: Payer: Self-pay | Admitting: Gynecology

## 2019-05-14 ENCOUNTER — Other Ambulatory Visit: Payer: Self-pay

## 2019-05-14 DIAGNOSIS — N63 Unspecified lump in unspecified breast: Secondary | ICD-10-CM

## 2019-05-14 DIAGNOSIS — R928 Other abnormal and inconclusive findings on diagnostic imaging of breast: Secondary | ICD-10-CM

## 2019-05-26 ENCOUNTER — Ambulatory Visit
Admission: RE | Admit: 2019-05-26 | Discharge: 2019-05-26 | Disposition: A | Discharge status: Home / Self Care | Payer: Medicare Other | Source: Ambulatory Visit | Attending: Gynecology | Admitting: Gynecology

## 2019-05-26 ENCOUNTER — Other Ambulatory Visit: Payer: Self-pay

## 2019-05-26 ENCOUNTER — Other Ambulatory Visit: Payer: Self-pay | Admitting: Gynecology

## 2019-05-26 DIAGNOSIS — R928 Other abnormal and inconclusive findings on diagnostic imaging of breast: Secondary | ICD-10-CM

## 2019-11-12 ENCOUNTER — Other Ambulatory Visit: Payer: Self-pay

## 2019-11-12 ENCOUNTER — Other Ambulatory Visit: Payer: Self-pay | Admitting: Gynecology

## 2019-11-12 ENCOUNTER — Ambulatory Visit
Admission: RE | Admit: 2019-11-12 | Discharge: 2019-11-12 | Disposition: A | Discharge status: Home / Self Care | Payer: Medicare Other | Source: Ambulatory Visit | Attending: Gynecology | Admitting: Gynecology

## 2019-11-12 DIAGNOSIS — N6489 Other specified disorders of breast: Secondary | ICD-10-CM

## 2019-11-12 DIAGNOSIS — R928 Other abnormal and inconclusive findings on diagnostic imaging of breast: Secondary | ICD-10-CM

## 2020-12-04 ENCOUNTER — Other Ambulatory Visit: Payer: Self-pay | Admitting: Gynecology

## 2020-12-04 DIAGNOSIS — Z09 Encounter for follow-up examination after completed treatment for conditions other than malignant neoplasm: Secondary | ICD-10-CM

## 2020-12-05 ENCOUNTER — Other Ambulatory Visit: Payer: Self-pay

## 2020-12-05 ENCOUNTER — Other Ambulatory Visit: Payer: Self-pay | Admitting: Gynecology

## 2020-12-05 ENCOUNTER — Ambulatory Visit
Admission: RE | Admit: 2020-12-05 | Discharge: 2020-12-05 | Disposition: A | Discharge status: Home / Self Care | Payer: Medicare Other | Source: Ambulatory Visit | Attending: Gynecology | Admitting: Gynecology

## 2020-12-05 DIAGNOSIS — Z09 Encounter for follow-up examination after completed treatment for conditions other than malignant neoplasm: Secondary | ICD-10-CM

## 2020-12-27 IMAGING — MG DIGITAL DIAGNOSTIC BILAT W/ TOMO W/ CAD
8 series · 8 of 24 positions shown · non-contrast
Comparison: Previous exam(s).

CLINICAL DATA: Bilateral screening recall for possible masses.The
patient has history of a benign biopsy in the left breast in 9778
demonstrating a fibroadenoma.

EXAM:
DIGITAL DIAGNOSTIC BILATERAL MAMMOGRAM WITH CAD AND TOMO
BILATERAL BREAST ULTRASOUND

[L CC synth-2D]
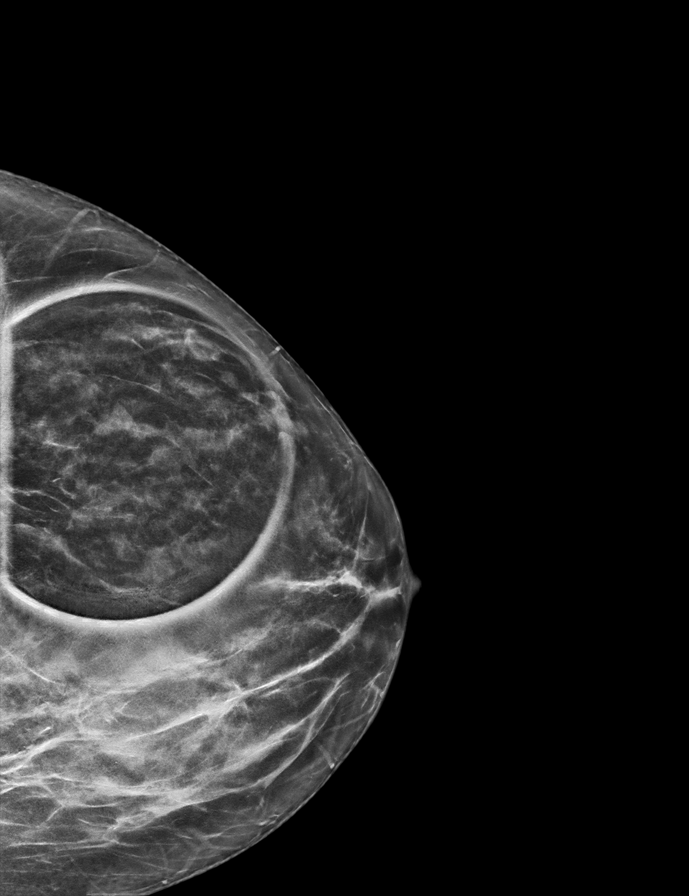

[L MLO synth-2D]
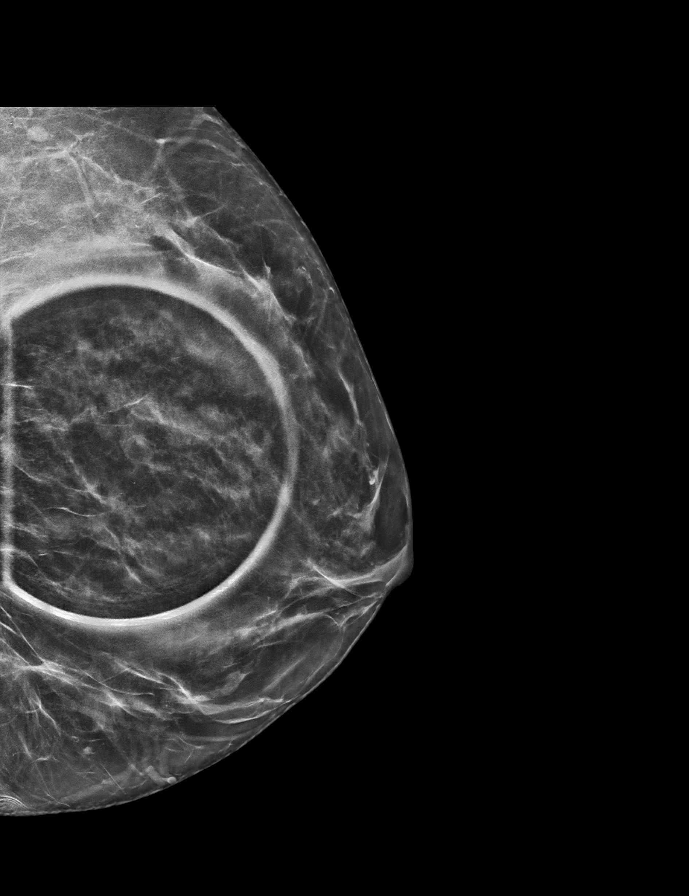

[R MLO synth-2D]
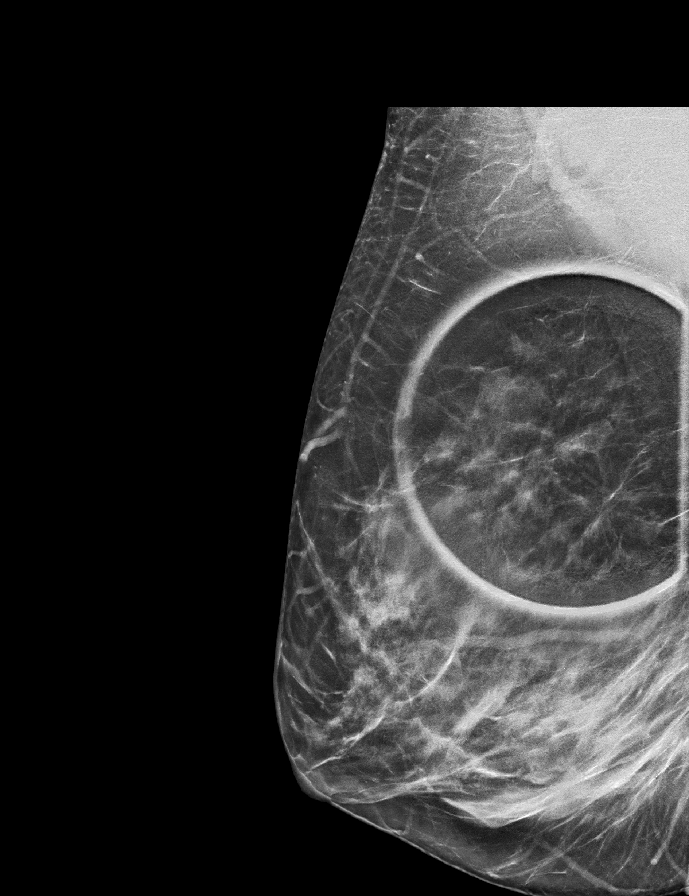

[R ML synth-2D]
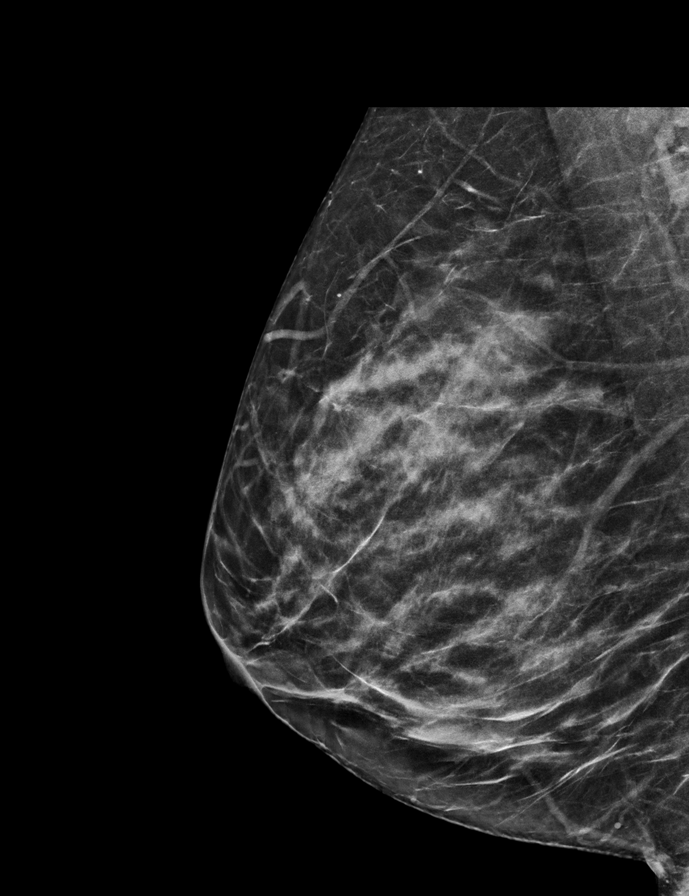

[L CC tomo · tomo slice 29/56.0]
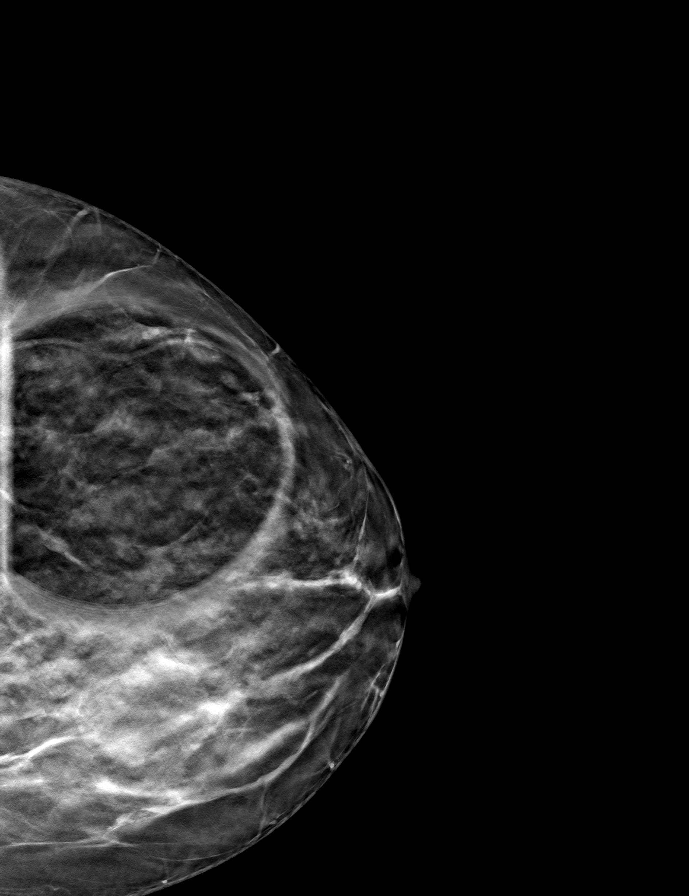

[L MLO tomo · tomo slice 32/63.0]
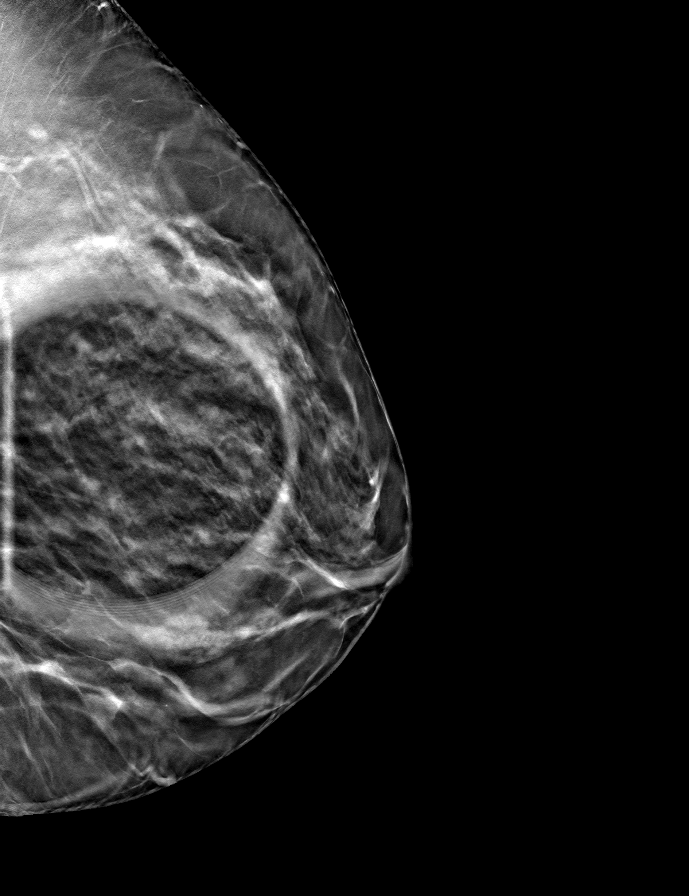

[R MLO tomo · tomo slice 32/63.0]
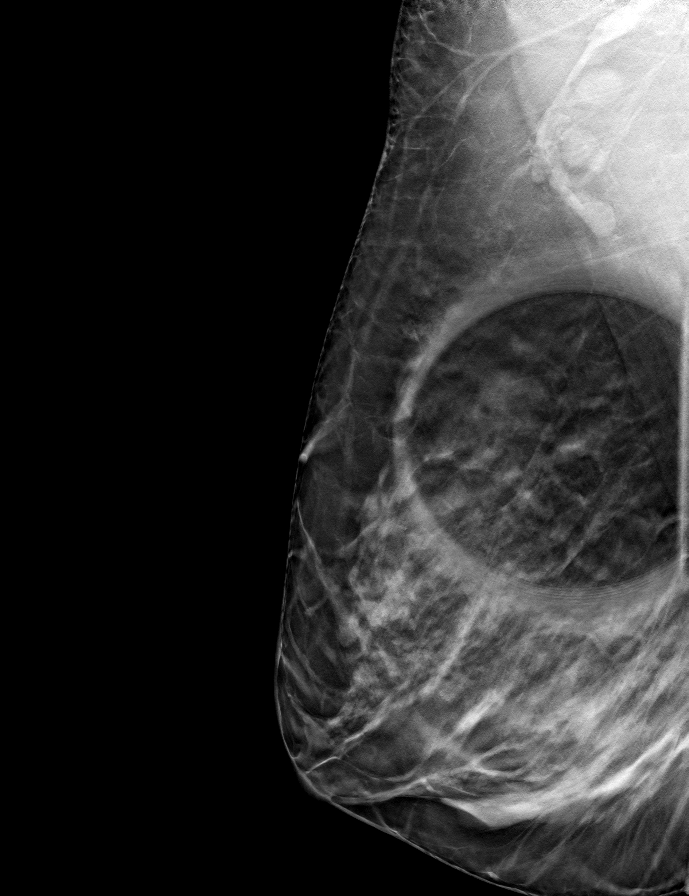

[R ML tomo · tomo slice 30/59.0]
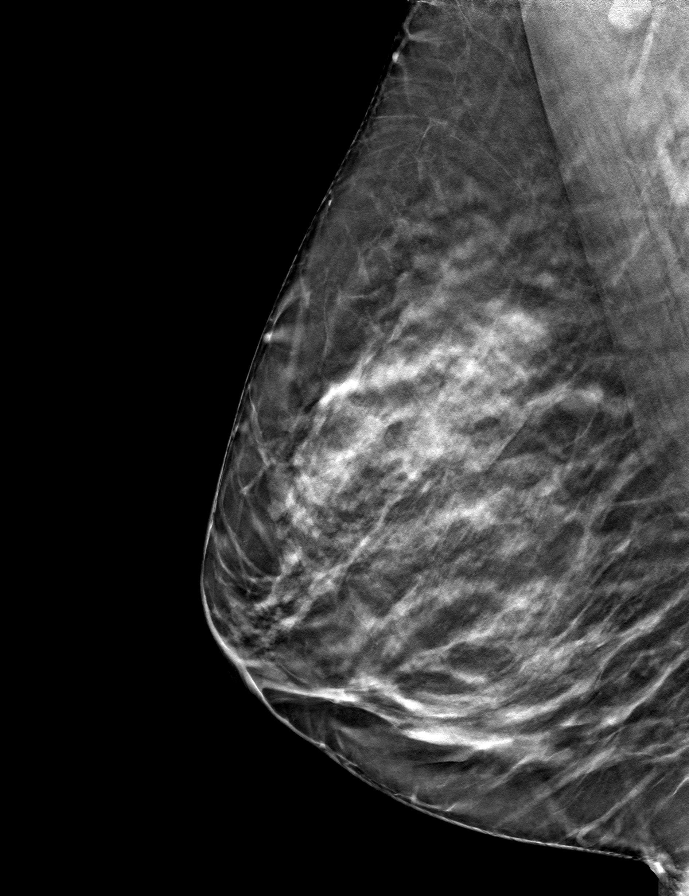

[8 of 24 positions shown; findings below may reference images not displayed]

ACR Breast Density Category c: The breast tissue is heterogeneously
dense, which may obscure small masses.
FINDINGS: Spot compression tomosynthesis images of the lateral left breast
demonstrates an obscured oval mass measuring approximately 1.0 cm.
In the lateral posterior right breast there are 2 adjacent
asymmetry/possible obscured masses, the larger measuring about
cm, and the smaller measuring about 7 mm.

Mammographic images were processed with CAD.

Ultrasound of the left breast at 3 o'clock, 6 cm from the nipple
demonstrates avascular lobulated hypoechoic mass measuring 1.0 x
x 0.7 cm. No suspicious lymph nodes are seen in the left axilla

Ultrasound of the lateral right breast demonstrates a
benign-appearing lymph node at 10 o'clock, 9 cm from the nipple. No
suspicious masses or areas of shadowing are seen in the upper-outer
right breast.
IMPRESSION: 1.  There is an indeterminate left breast mass at 3 o'clock.

2.  No evidence of left axillary lymphadenopathy.

3. There are 2 right breast asymmetries without a clear sonographic
correlate. These are likely benign.

RECOMMENDATION:
1. Ultrasound-guided biopsy is recommended for the left breast mass
at 3 o'clock. This has been scheduled for 05/26/2019 at [DATE] a.m.

2. Six-month follow-up right breast mammogram is recommended for the
2 asymmetries in the upper-outer quadrant.

I have discussed the findings and recommendations with the patient.
If applicable, a reminder letter will be sent to the patient
regarding the next appointment.

BI-RADS CATEGORY  4: Suspicious.

## 2021-01-08 IMAGING — MG MM BREAST LOCALIZATION CLIP
4 series · 4 of 12 positions shown · non-contrast
Comparison: Previous exam(s).

CLINICAL DATA: A biopsy marker

EXAM:
DIAGNOSTIC LEFT MAMMOGRAM POST ULTRASOUND BIOPSY

[L CC synth-2D]
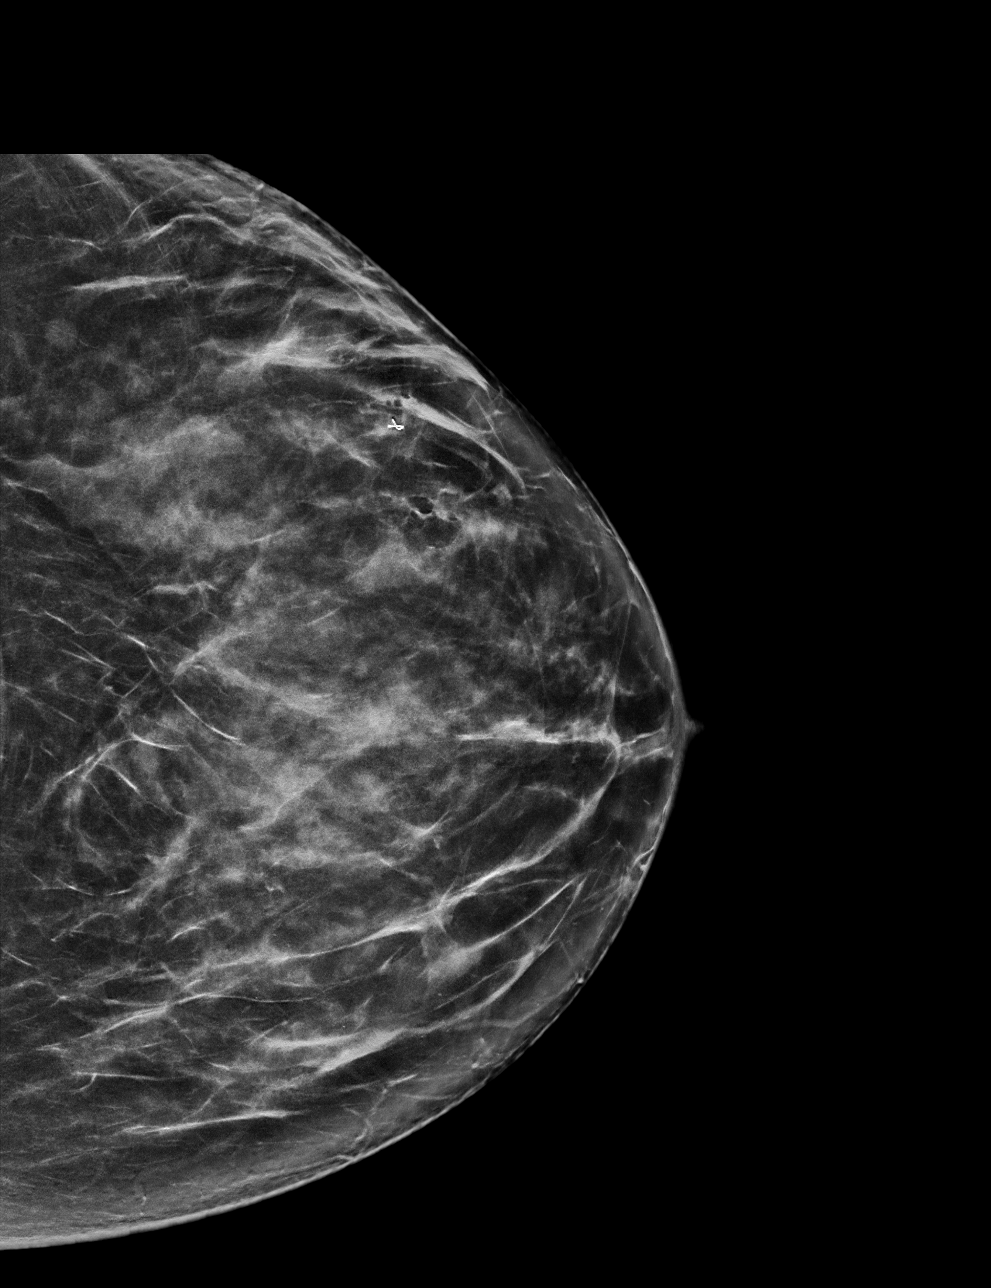

[L ML synth-2D]
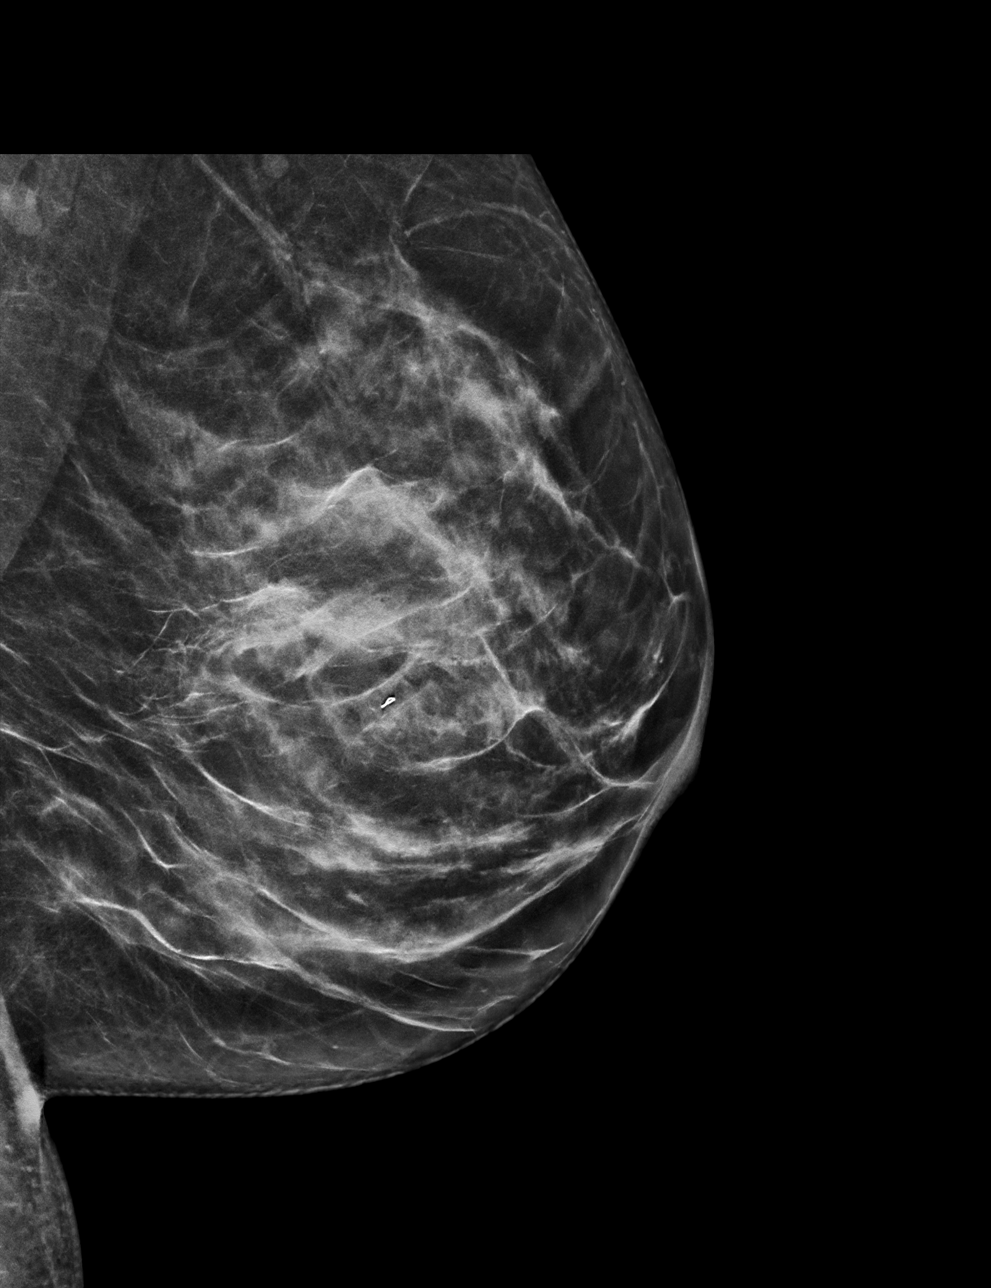

[L ML tomo · tomo slice 34/67.0]
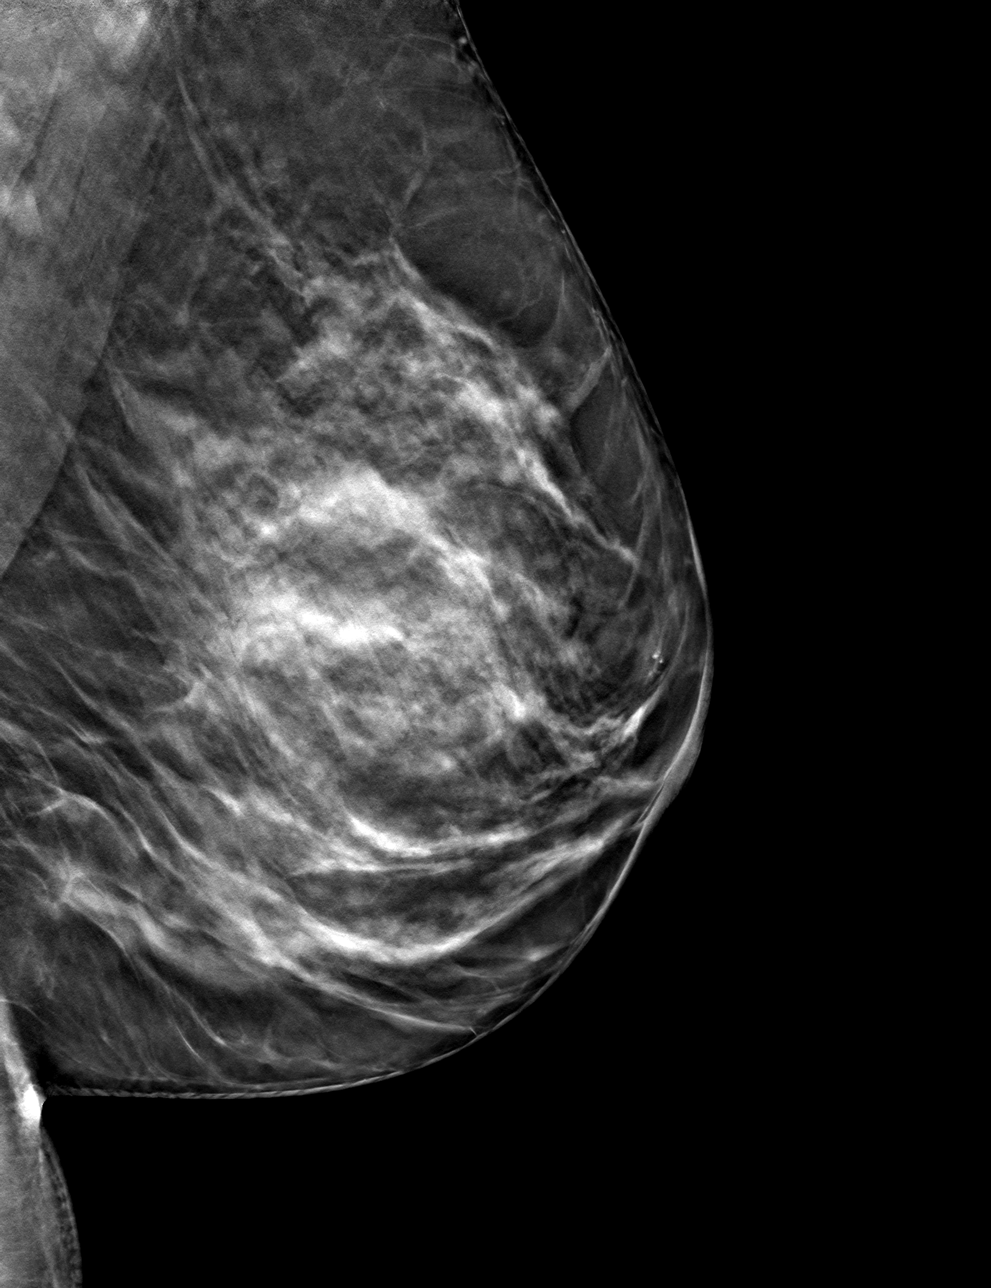

[L CC tomo · tomo slice 35/70.0]
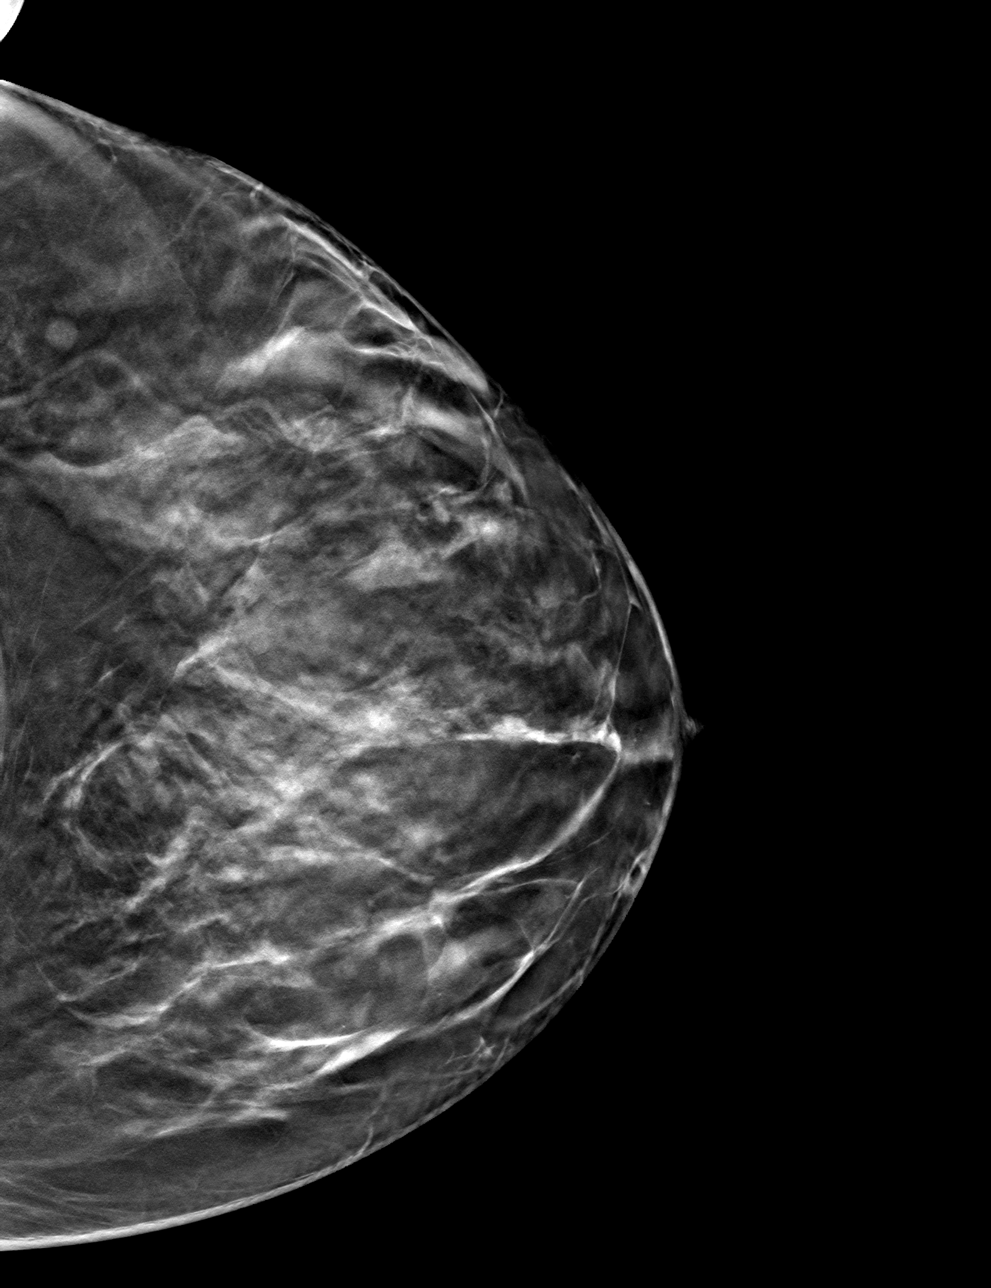

[4 of 12 positions shown; findings below may reference images not displayed]

FINDINGS: Mammographic images were obtained following ultrasound guided biopsy
of a left breast mass. The biopsy marking clip is in close proximity
to the biopsied mass. The mass itself is not well seen due to
surrounding lidocaine.
IMPRESSION: The ribbon shaped biopsy clip is in close proximity to the biopsied
mass. The mass itself is not well seen due to surrounding lidocaine.

Final Assessment: Post Procedure Mammograms for Marker Placement

## 2022-04-16 ENCOUNTER — Other Ambulatory Visit: Payer: Self-pay

## 2022-04-16 ENCOUNTER — Observation Stay (HOSPITAL_COMMUNITY)
Admission: EM | Admit: 2022-04-16 | Discharge: 2022-04-16 | Disposition: A | Discharge status: Home / Self Care | Payer: Medicare Other | Attending: Internal Medicine | Admitting: Internal Medicine

## 2022-04-16 ENCOUNTER — Encounter (HOSPITAL_COMMUNITY): Payer: Self-pay

## 2022-04-16 ENCOUNTER — Emergency Department (HOSPITAL_COMMUNITY): Payer: Medicare Other

## 2022-04-16 DIAGNOSIS — R0602 Shortness of breath: Secondary | ICD-10-CM | POA: Diagnosis present

## 2022-04-16 DIAGNOSIS — Z1152 Encounter for screening for COVID-19: Secondary | ICD-10-CM | POA: Diagnosis not present

## 2022-04-16 DIAGNOSIS — J189 Pneumonia, unspecified organism: Secondary | ICD-10-CM | POA: Diagnosis not present

## 2022-04-16 DIAGNOSIS — F32A Depression, unspecified: Secondary | ICD-10-CM | POA: Insufficient documentation

## 2022-04-16 DIAGNOSIS — F319 Bipolar disorder, unspecified: Secondary | ICD-10-CM

## 2022-04-16 DIAGNOSIS — Z79899 Other long term (current) drug therapy: Secondary | ICD-10-CM | POA: Diagnosis not present

## 2022-04-16 LAB — PROTIME-INR
INR: 1 (ref 0.8–1.2)
Prothrombin Time: 13 seconds (ref 11.4–15.2)

## 2022-04-16 LAB — URINALYSIS, ROUTINE W REFLEX MICROSCOPIC
Bacteria, UA: NONE SEEN
Bilirubin Urine: NEGATIVE
Glucose, UA: NEGATIVE mg/dL
Hgb urine dipstick: NEGATIVE
Ketones, ur: NEGATIVE mg/dL
Nitrite: NEGATIVE
Protein, ur: 100 mg/dL — AB
Specific Gravity, Urine: 1.024 (ref 1.005–1.030)
pH: 5 (ref 5.0–8.0)

## 2022-04-16 LAB — LIPASE, BLOOD: Lipase: 27 U/L (ref 11–51)

## 2022-04-16 LAB — MAGNESIUM: Magnesium: 2.2 mg/dL (ref 1.7–2.4)

## 2022-04-16 LAB — BASIC METABOLIC PANEL
Anion gap: 9 (ref 5–15)
BUN: 7 mg/dL (ref 6–20)
CO2: 26 mmol/L (ref 22–32)
Calcium: 8.8 mg/dL — ABNORMAL LOW (ref 8.9–10.3)
Chloride: 99 mmol/L (ref 98–111)
Creatinine, Ser: 0.95 mg/dL (ref 0.44–1.00)
GFR, Estimated: >60 mL/min (ref >60)
Glucose, Bld: 126 mg/dL — ABNORMAL HIGH (ref 70–99)
Potassium: 3.5 mmol/L (ref 3.5–5.1)
Sodium: 134 mmol/L — ABNORMAL LOW (ref 135–145)

## 2022-04-16 LAB — CBC
HCT: 45.2 % (ref 36.0–46.0)
HCT: 44.8 % (ref 36.0–46.0)
Hemoglobin: 15 g/dL (ref 12.0–15.0)
Hemoglobin: 14.7 g/dL (ref 12.0–15.0)
MCH: 30.9 pg (ref 26.0–34.0)
MCH: 31.1 pg (ref 26.0–34.0)
MCHC: 33.2 g/dL (ref 30.0–36.0)
MCHC: 32.8 g/dL (ref 30.0–36.0)
MCV: 93 fL (ref 80.0–100.0)
MCV: 94.7 fL (ref 80.0–100.0)
Platelets: 590 10*3/uL — ABNORMAL HIGH (ref 150–400)
Platelets: 723 10*3/uL — ABNORMAL HIGH (ref 150–400)
RBC: 4.86 MIL/uL (ref 3.87–5.11)
RBC: 4.73 MIL/uL (ref 3.87–5.11)
RDW: 12.3 % (ref 11.5–15.5)
RDW: 12.2 % (ref 11.5–15.5)
WBC: 21.6 10*3/uL — ABNORMAL HIGH (ref 4.0–10.5)
WBC: 24.9 10*3/uL — ABNORMAL HIGH (ref 4.0–10.5)
nRBC: 0 % (ref 0.0–0.2)
nRBC: 0 % (ref 0.0–0.2)

## 2022-04-16 LAB — TROPONIN I (HIGH SENSITIVITY)
Troponin I (High Sensitivity): 2 ng/L (ref <18)
Troponin I (High Sensitivity): 2 ng/L (ref <18)

## 2022-04-16 LAB — HEPATIC FUNCTION PANEL
ALT: 11 U/L (ref 0–44)
AST: 11 U/L — ABNORMAL LOW (ref 15–41)
Albumin: 3.5 g/dL (ref 3.5–5.0)
Alkaline Phosphatase: 66 U/L (ref 38–126)
Bilirubin, Direct: 0.1 mg/dL (ref 0.0–0.2)
Indirect Bilirubin: 0.4 mg/dL (ref 0.3–0.9)
Total Bilirubin: 0.5 mg/dL (ref 0.3–1.2)
Total Protein: 8.4 g/dL — ABNORMAL HIGH (ref 6.5–8.1)

## 2022-04-16 LAB — DIFFERENTIAL
Abs Immature Granulocytes: 0.17 10*3/uL — ABNORMAL HIGH (ref 0.00–0.07)
Basophils Absolute: 0.1 10*3/uL (ref 0.0–0.1)
Basophils Relative: 0 %
Eosinophils Absolute: 0 10*3/uL (ref 0.0–0.5)
Eosinophils Relative: 0 %
Immature Granulocytes: 1 %
Lymphocytes Relative: 11 %
Lymphs Abs: 2.8 10*3/uL (ref 0.7–4.0)
Monocytes Absolute: 1.6 10*3/uL — ABNORMAL HIGH (ref 0.1–1.0)
Monocytes Relative: 6 %
Neutro Abs: 20.3 10*3/uL — ABNORMAL HIGH (ref 1.7–7.7)
Neutrophils Relative %: 82 %
nRBC: 0 /100 WBC

## 2022-04-16 LAB — RESP PANEL BY RT-PCR (RSV, FLU A&B, COVID)  RVPGX2
Influenza A by PCR: NEGATIVE
Influenza B by PCR: NEGATIVE
Resp Syncytial Virus by PCR: NEGATIVE
SARS Coronavirus 2 by RT PCR: NEGATIVE

## 2022-04-16 LAB — LACTIC ACID, PLASMA: Lactic Acid, Venous: 1 mmol/L (ref 0.5–1.9)

## 2022-04-16 LAB — TSH: TSH: 2.777 u[IU]/mL (ref 0.350–4.500)

## 2022-04-16 LAB — APTT: aPTT: 24 seconds (ref 24–36)

## 2022-04-16 MED ORDER — DOXYCYCLINE HYCLATE 100 MG PO CAPS: 100.0000 mg | ORAL_CAPSULE | Freq: Two times a day (BID) | ORAL | 0 refills | Status: AC

## 2022-04-16 MED ORDER — ENOXAPARIN SODIUM 40 MG/0.4ML IJ SOSY: 40.0000 mg | PREFILLED_SYRINGE | INTRAMUSCULAR | Status: DC

## 2022-04-16 MED ORDER — AZITHROMYCIN 250 MG PO TABS
500.0000 mg | ORAL_TABLET | Freq: Once | ORAL | Status: AC
  Administered 2022-04-16: 500 mg via ORAL
  Filled 2022-04-16: qty 2

## 2022-04-16 MED ORDER — SODIUM CHLORIDE 0.9 % IV SOLN
1.0000 g | Freq: Once | INTRAVENOUS | Status: AC
  Administered 2022-04-16: 1 g via INTRAVENOUS
  Filled 2022-04-16: qty 10

## 2022-04-16 MED ORDER — ONDANSETRON HCL 4 MG/2ML IJ SOLN: 4.0000 mg | Freq: Four times a day (QID) | INTRAMUSCULAR | Status: DC | PRN

## 2022-04-16 MED ORDER — SODIUM CHLORIDE 0.9% FLUSH: 3.0000 mL | Freq: Two times a day (BID) | INTRAVENOUS | Status: DC

## 2022-04-16 MED ORDER — ACETAMINOPHEN 325 MG PO TABS: 650.0000 mg | ORAL_TABLET | Freq: Four times a day (QID) | ORAL | Status: DC | PRN

## 2022-04-16 MED ORDER — KETOROLAC TROMETHAMINE 15 MG/ML IJ SOLN
15.0000 mg | Freq: Once | INTRAMUSCULAR | Status: AC
  Administered 2022-04-16: 15 mg via INTRAVENOUS
  Filled 2022-04-16: qty 1

## 2022-04-16 MED ORDER — METHOCARBAMOL 1000 MG/10ML IJ SOLN: 500.0000 mg | Freq: Four times a day (QID) | INTRAVENOUS | Status: DC | PRN

## 2022-04-16 MED ORDER — SODIUM CHLORIDE 0.9 % IV SOLN: 250.0000 mL | INTRAVENOUS | Status: DC | PRN

## 2022-04-16 MED ORDER — HYDROCODONE-ACETAMINOPHEN 5-325 MG PO TABS: 1.0000 | ORAL_TABLET | ORAL | Status: DC | PRN

## 2022-04-16 MED ORDER — ONDANSETRON HCL 4 MG PO TABS: 4.0000 mg | ORAL_TABLET | Freq: Four times a day (QID) | ORAL | Status: DC | PRN

## 2022-04-16 MED ORDER — SODIUM CHLORIDE 0.9 % IV BOLUS
1000.0000 mL | Freq: Once | INTRAVENOUS | Status: AC
  Administered 2022-04-16: 1000 mL via INTRAVENOUS

## 2022-04-16 MED ORDER — HYDRALAZINE HCL 20 MG/ML IJ SOLN: 10.0000 mg | Freq: Four times a day (QID) | INTRAMUSCULAR | Status: DC | PRN

## 2022-04-16 MED ORDER — SODIUM CHLORIDE 0.9% FLUSH: 3.0000 mL | INTRAVENOUS | Status: DC | PRN

## 2022-04-16 MED ORDER — ALBUTEROL SULFATE (2.5 MG/3ML) 0.083% IN NEBU: 2.5000 mg | INHALATION_SOLUTION | Freq: Four times a day (QID) | RESPIRATORY_TRACT | Status: DC | PRN

## 2022-04-16 MED ORDER — KETOROLAC TROMETHAMINE 30 MG/ML IJ SOLN: 30.0000 mg | Freq: Four times a day (QID) | INTRAMUSCULAR | Status: DC | PRN

## 2022-04-16 MED ORDER — GUAIFENESIN ER 600 MG PO TB12: 600.0000 mg | ORAL_TABLET | Freq: Two times a day (BID) | ORAL | Status: DC

## 2022-04-16 MED ORDER — SENNOSIDES-DOCUSATE SODIUM 8.6-50 MG PO TABS: 1.0000 | ORAL_TABLET | Freq: Every evening | ORAL | Status: DC | PRN

## 2022-04-16 MED ORDER — ACETAMINOPHEN 650 MG RE SUPP: 650.0000 mg | Freq: Four times a day (QID) | RECTAL | Status: DC | PRN

## 2022-04-16 NOTE — ED Provider Notes (Signed)
Care assumed from Suella Broad, PA-C at shift change pending sepsis labs.  See her note for full HPI.  In short, patient is a 52 year old female who presents to the ED with upper abdominal and lower chest pain that started yesterday after eating [employer].  She is a current smoker and admits to a chronic cough with no change in characteristics.  At shift change, patient diagnosed with pneumonia and already treated with IV fluids and antibiotics.  Pending sepsis labs given leukocytosis of 21.  Possible d/c if improvement vs. admission Physical Exam  BP 122/79   Pulse (!) 116   Temp 98 F (36.7 C) (Oral)   Resp 20   Ht '5\' 4"'$  (1.626 m)   Wt 81.6 kg   SpO2 96%   BMI 30.90 kg/m   Physical Exam Vitals and nursing note reviewed.  Constitutional:      General: She is not in acute distress.    Appearance: She is not ill-appearing.  HENT:     Head: Normocephalic.  Eyes:     Pupils: Pupils are equal, round, and reactive to light.  Cardiovascular:     Rate and Rhythm: Normal rate and regular rhythm.     Pulses: Normal pulses.     Heart sounds: Normal heart sounds. No murmur heard.    No friction rub. No gallop.  Pulmonary:     Effort: Pulmonary effort is normal.     Breath sounds: Normal breath sounds.  Abdominal:     General: Abdomen is flat. There is no distension.     Palpations: Abdomen is soft.     Tenderness: There is no abdominal tenderness. There is no guarding or rebound.  Musculoskeletal:        General: Normal range of motion.     Cervical back: Neck supple.  Skin:    General: Skin is warm and dry.  Neurological:     General: No focal deficit present.     Mental Status: She is alert.  Psychiatric:        Mood and Affect: Mood normal.        Behavior: Behavior normal.     Procedures  Procedures  ED Course / MDM   Clinical Course as of 04/16/22 1710  Tue Apr 16, 2022  1424 WBC(!): 21.6 [CA]  1547 Lactic Acid, Venous: 1.0 [CA]    Clinical Course User  Index [CA] [name], PA-C   Medical Decision Making Amount and/or Complexity of Data Reviewed Labs: ordered. Decision-making details documented in ED Course. Radiology: ordered and independent interpretation performed. Decision-making details documented in ED Course.  Risk Prescription drug management. Decision regarding hospitalization.   Care assumed from Suella Broad, PA-C at shift change pending sepsis labs.  See her note for full HPI.   Durning for right lower lobe pneumonia.  Leukocytosis of 21.  Patient already treated with IV fluids and antibiotics by previous provider.  At shift change, pending sepsis labs given significant leukocytosis.  Reassessed patient at bedside.  Patient admits to continued pain.  Toradol given.  Patient continues to be tachycardic and tachypneic.  Given significant leukocytosis and findings of pneumonia on chest x-ray feel patient would benefit from admission for IV antibiotics.  Blood cultures pending.  Lactic acid normal. Possible sepsis secondary to PNA. More IVFs given.   Spoke with Dr. Louanne Belton with [facility] who agrees to admit patient.  5:39 PM informed by RN that patient no longer wants to be admitted.  Reported to patient's bedside  to discuss concern for possible sepsis secondary to pneumonia.  Patient reports understanding.  Patient still requesting to go home given she has children at home that she needs to take care of.  Advised patient to return to the ER for worsening symptoms.  Patient was made aware that if her blood cultures are positive she will need to return to the ED.  Patient discharged with antibiotics.  No evidence of respiratory distress during her ED stay.  Oxygen has maintained above 95%.  COVID/influenza negative. Strict ED precautions discussed with patient. Patient states understanding and agrees to plan. Patient discharged home in no acute distress and stable vitals  Has PCP Smoker        Karie Kirks 04/16/22 1839    Lacretia Leigh, MD 04/17/22 [phone]

## 2022-04-16 NOTE — ED Triage Notes (Addendum)
Patient said it feels like someone is stabbing her in her diaphragm. Began hurting after she ate bojangles, she thought it was gas. Said she cannot catch her breath even at rest.

## 2022-04-16 NOTE — ED Provider Notes (Signed)
[facility] DEPT Provider Note   CSN: [id number] Arrival date & time: 04/16/22  1022     History  Chief Complaint  Patient presents with   Shortness of Breath    Tara Gentry is a 52 y.o. female.  Pt complains of pain across upper abdomen/lower chest, onset yesterday around 7pm after eating bojangles. Took gas x and prilosec without improvement. Pain worse with taking a deep breath. Smoker- no changes in chronic cough which is productive (yellow/black mucous).  No lower ext edema, no PE/DVT history. Went to UC, sent to ER.        Home Medications Prior to Admission medications   Medication Sig Start Date End Date Taking? Authorizing Provider  ALPRAZolam Duanne Moron) 1 MG tablet Take 1 mg by mouth 4 (four) times daily.     [provider]  cloNIDine (CATAPRES) 0.1 MG tablet Take 0.2 mg by mouth at bedtime.    [provider]  HYDROcodone-acetaminophen (NORCO/VICODIN) 5-325 MG per tablet Take 2 tablets by mouth every 4 (four) hours as needed. Patient not taking: Reported on 11/11/2014 06/19/14   Tara Matin, MD  lamoTRIgine (LAMICTAL) 100 MG tablet Take 100-200 mg by mouth 2 (two) times daily. 200 mg every morning and 100 mg every night 07/25/09   [provider]  Lurasidone HCl (LATUDA) 120 MG TABS Take 120 mg by mouth daily.    [provider]  methylphenidate (RITALIN) 20 MG tablet Take 20 mg by mouth 4 (four) times daily.    [provider]  norgestimate-ethinyl estradiol (MONONESSA) 0.25-35 MG-MCG tablet Take 1 tablet by mouth daily.    [provider]  omeprazole (PRILOSEC) 20 MG capsule Take 40 mg by mouth daily.    [provider]      Allergies    Patient has no known allergies.    Review of Systems   Review of Systems Negative except as per HPI Physical Exam Updated Vital Signs BP 122/79   Pulse (!) 116   Temp 98 F (36.7 C) (Oral)   Resp 20   Ht '5\' 4"'$  (1.626 m)   Wt 81.6  kg   SpO2 96%   BMI 30.90 kg/m  Physical Exam Vitals and nursing note reviewed.  Constitutional:      General: She is not in acute distress.    Appearance: She is well-developed. She is not diaphoretic.  HENT:     Head: Normocephalic and atraumatic.  Cardiovascular:     Rate and Rhythm: Regular rhythm. Tachycardia present.  Pulmonary:     Effort: Pulmonary effort is normal.     Breath sounds: Examination of the right-lower field reveals rhonchi. Examination of the left-lower field reveals rhonchi. Rhonchi present.  Abdominal:     Palpations: Abdomen is soft.     Tenderness: There is no abdominal tenderness.  Musculoskeletal:     Cervical back: Neck supple.     Right lower leg: No tenderness. No edema.     Left lower leg: No tenderness. No edema.  Skin:    General: Skin is warm and dry.     Findings: No erythema or rash.  Neurological:     Mental Status: She is alert and oriented to person, place, and time.  Psychiatric:        Behavior: Behavior normal.     ED Results / Procedures / Treatments   Labs (all labs ordered are listed, but only abnormal results are displayed) Labs Reviewed  BASIC METABOLIC PANEL -  Abnormal; Notable for the following components:      Result Value   Sodium 134 (*)    Glucose, Bld 126 (*)    Calcium 8.8 (*)    All other components within normal limits  CBC - Abnormal; Notable for the following components:   WBC 21.6 (*)    Platelets 590 (*)    All other components within normal limits  CULTURE, BLOOD (ROUTINE X 2)  CULTURE, BLOOD (ROUTINE X 2)  CULTURE, BLOOD (ROUTINE X 2)  CULTURE, BLOOD (ROUTINE X 2)  URINE CULTURE  MAGNESIUM  TSH  LACTIC ACID, PLASMA  LACTIC ACID, PLASMA  DIFFERENTIAL  PROTIME-INR  APTT  URINALYSIS, ROUTINE W REFLEX MICROSCOPIC  HEPATIC FUNCTION PANEL  CBC  I-STAT BETA HCG BLOOD, ED ([facility], [facility], [facility] ONLY)  TROPONIN I (HIGH SENSITIVITY)  TROPONIN I (HIGH SENSITIVITY)    EKG None  Radiology DG Chest 2  View  Result Date: 04/16/2022 CLINICAL DATA:  Chest pain since last night, worsening with the inspiration. EXAM: CHEST - 2 VIEW COMPARISON:  Portable examination earlier the same date. No other relevant prior studies. FINDINGS: The heart size and mediastinal contours are stable. Persistent patchy airspace disease medially at the right lung base, superimposed over the thoracic spine on the lateral view. There is no obscuration of the right heart border or hemidiaphragm. The left lung appears clear. No pleural effusion or pneumothorax. The bones appear unremarkable. IMPRESSION: Persistent patchy airspace disease medially in the right lower lobe, suspicious for pneumonia or aspiration. Electronically Signed   By: Richardean Sale M.D.   On: 04/16/2022 12:50   DG Chest Port 1 View  Result Date: 04/16/2022 CLINICAL DATA:  Chest pain and shortness of breath, pain in the LEFT chest. EXAM: PORTABLE CHEST 1 VIEW COMPARISON:  None Available. FINDINGS: Kg leads project over the chest. Hazy opacity on the RIGHT along the RIGHT cardiac border and partially obscuring RIGHT hemidiaphragm. Cardiomediastinal contours and hilar structures are otherwise unremarkable. No pneumothorax. No gross pleural effusion. On limited assessment no acute skeletal process. IMPRESSION: Hazy opacity on the RIGHT along the RIGHT cardiac border and partially obscuring RIGHT heart border and RIGHT medial hemidiaphragm. Findings are suspicious for pneumonia in the appropriate clinical setting. Would suggest PA and lateral chest radiograph for further evaluation. Would also suggest follow-up to ensure resolution and exclude the possibility of underlying mass or lesion. Electronically Signed   By: Zetta Bills M.D.   On: 04/16/2022 10:58    Procedures Procedures    Medications Ordered in ED Medications  cefTRIAXone (ROCEPHIN) 1 g in sodium chloride 0.9 % 100 mL IVPB (has no administration in time range)  azithromycin (ZITHROMAX) tablet 500 mg  (has no administration in time range)  sodium chloride 0.9 % bolus 1,000 mL (has no administration in time range)    ED Course/ Medical Decision Making/ A&P Clinical Course as of 04/16/22 1440  Tue Apr 16, 2022  1424 WBC(!): 21.6 [CA]    Clinical Course User Index [CA] Tara Bouchard, PA-C                           Medical Decision Making Amount and/or Complexity of Data Reviewed Labs: ordered. Radiology: ordered.  Risk Prescription drug management.   This patient presents to the ED for concern of shortness of breath, lower chest pain, cough, this involves an extensive number of treatment options, and is a complaint that carries with it a high risk  of complications and morbidity.  The differential diagnosis includes but not limited to pneumonia, pneumothorax, ACS   Co morbidities that complicate the patient evaluation  Smoker, polysubstance abuse, bipolar disorder, viral hepatitis C, ADHD   Additional history obtained:  External records from outside source obtained and reviewed including prior labs on file for comparison   Lab Tests:  I Ordered, and personally interpreted labs.  The pertinent results include: CBC with leukocytosis white count of 21.6.  Add on differential.  BMP without significant findings.  Magnesium normal.  Troponin negative at 2. Additional lab work including lactic acid pending at time of signout.   Imaging Studies ordered:  I ordered imaging studies including chest x-ray I independently visualized and interpreted imaging which showed concern for pneumonia I agree with the radiologist interpretation   Cardiac Monitoring: / EKG:  The patient was maintained on a cardiac monitor.  I personally viewed and interpreted the cardiac monitored which showed an underlying rhythm of: Sinus tachycardia, rate 132   Problem List / ED Course / Critical interventions / Medication management  52 year old female with upper abdominal/lower chest pain,  cough, SHOB onset last night. Found to have PNA on CXR with elevated WBC at 21k. Add on sepsis labs, afebrile, no additional oxygen requirement.  Provided with Rocephin and Zithromax for pneumonia, IV fluids for her tachycardia.  Plan is to reassess after lab work complete.  Care signed out to oncoming provider at change of shift. I ordered medication including Rocephin, Zithromax, IV fluids for pneumonia Reevaluation of the patient after these medicines showed that the patient stayed the same I have reviewed the patients home medicines and have made adjustments as needed   Social Determinants of Health:  No PCP on file   Test / Admission - Considered:  Disposition pending at time of signout         Final Clinical Impression(s) / ED Diagnoses Final diagnoses:  Community acquired pneumonia of right lung, unspecified part of lung    Rx / DC Orders ED Discharge Orders     None         Tacy Learn, PA-C 04/16/22 1440    Regan Lemming, MD 04/16/22 1614

## 2022-04-16 NOTE — ED Provider Triage Note (Addendum)
Emergency Medicine Provider Triage Evaluation Note  Tara Gentry , a 52 y.o. female  was evaluated in triage.  Pt complains of pain across upper abdomen/lower chest, onset yesterday around 7pm after eating bojangles. Took gas x and prilosec without improvement. Pain worse with taking a deep breath. Smoker- no changes in chronic cough which is productive (yellow/black mucous).  No lower ext edema, no PE/DVT history. Went to UC, sent to ER. Review of Systems  Positive: As above Negative: As above  Physical Exam  BP (!) 146/100 (BP Location: Left Arm)   Pulse (!) 134   Temp 98 F (36.7 C) (Oral)   Resp 16   Ht '5\' 4"'$  (1.626 m)   Wt 81.6 kg   SpO2 95%   BMI 30.90 kg/m  Gen:   Awake, no distress   Resp:  Tachypenic  MSK:   Moves extremities without difficulty  Other:    Medical Decision Making  Medically screening exam initiated at 10:37 AM.  Appropriate orders placed.  Tara Gentry was informed that the remainder of the evaluation will be completed by another provider, this initial triage assessment does not replace that evaluation, and the importance of remaining in the ED until their evaluation is complete.     Tacy Learn, PA-C 04/16/22 1039    Tacy Learn, PA-C 04/16/22 1040

## 2022-04-16 NOTE — Discharge Instructions (Addendum)
It was a pleasure taking care of you today.  As discussed, your chest x-ray showed evidence of pneumonia.  You were treated with IV antibiotics here in the ER.  You were offered admission which you declined.  If your blood cultures are positive you will be called to return to the ER for admission.  I am sending you home with antibiotics.  Take as prescribed and finish all antibiotics.  Please follow-up with PCP later this week for further evaluation.  Return to the ER for new or worsening symptoms.

## 2022-04-16 NOTE — H&P (Incomplete)
[facility] History and Physical  Tara Gentry CLE:[id number] DOB: [birth year] DOA: 04/16/2022  Referring physician: ED  PCP: Leota Jacobsen, MD   Patient is coming from: Home  Chief Complaint: Abdominal pain/chest pain,  HPI: Patient is a 52 years old female with past medical history of anxiety, bipolar 1 disorder, depressive disorder presented to the hospital with pain in the lower chest and upper abdomen after eating some [employer].  She also had trouble breathing even at rest.  Took some Gas-X and Prilosec without improvement.  She reported pain worse on taking deep breaths.  She does have a chronic cough with productive sputum at baseline.  Patient had initially gone to urgent care center and was referred to emergency department for further evaluation and treatment.  In the ED, patient was noted to be tachycardic and had some rhonchi on physical exam.  She was mildly tachypneic as well with initial temperature of 98 F.  Initial lab showed mild hyponatremia at 134 with leukocytosis at 21.6.  X-ray of the chest showed patchy airspace opacity in the right lower lobe suspicious for pneumonia.  Blood cultures were sent from the ED and patient was considered for admission to the hospital for further evaluation and treatment.  In the ED, patient received Rocephin and Zithromax and IV fluids for probable sepsis from pneumonia.  Patient was then considered for admission to hospital for further evaluation and treatment.  Assessment and Plan  Sepsis secondary to right lower lobe pneumonia.  Patient presented with shortness of breath, chest pain, leukocytosis, tachycardia and tachypnea.  Blood cultures have been sent from the ED.  Continue Rocephin and Zithromax.    History of anxiety, bipolar disorder, depression.  Smoker    DVT Prophylaxis: Lovenox subcu  Review of Systems:  All systems were reviewed and were negative unless otherwise mentioned in the HPI   Past Medical History:   Diagnosis Date   ADHD (attention deficit hyperactivity disorder)    Anxiety    Bipolar 1 disorder (HCC)    Depressive disorder    Gastroduodenitis    Viral hepatitis C    Past Surgical History:  Procedure Laterality Date   BREAST BIOPSY Left    05/2019   CYST REMOVAL HAND      Social History:  reports that she has quit smoking. Her smoking use included cigarettes. She has never used smokeless tobacco. She reports current drug use. Drug: Marijuana. She reports that she does not drink alcohol.  No Known Allergies  History reviewed. No pertinent family history.   Prior to Admission medications   Medication Sig Start Date End Date Taking? Authorizing Provider  ALPRAZolam Duanne Moron) 1 MG tablet Take 1 mg by mouth 4 (four) times daily.     [provider]  cloNIDine (CATAPRES) 0.1 MG tablet Take 0.2 mg by mouth at bedtime.    [provider]  HYDROcodone-acetaminophen (NORCO/VICODIN) 5-325 MG per tablet Take 2 tablets by mouth every 4 (four) hours as needed. Patient not taking: Reported on 11/11/2014 06/19/14   [name], MD  lamoTRIgine (LAMICTAL) 100 MG tablet Take 100-200 mg by mouth 2 (two) times daily. 200 mg every morning and 100 mg every night 07/25/09   [provider]  Lurasidone HCl (LATUDA) 120 MG TABS Take 120 mg by mouth daily.    [provider]  methylphenidate (RITALIN) 20 MG tablet Take 20 mg by mouth 4 (four) times daily.    [provider]  norgestimate-ethinyl estradiol (MONONESSA) 0.25-35 MG-MCG  tablet Take 1 tablet by mouth daily.    [provider]  omeprazole (PRILOSEC) 20 MG capsule Take 40 mg by mouth daily.    [provider]    Physical Exam: Vitals:   04/16/22 1615 04/16/22 1630 04/16/22 1645 04/16/22 1700  BP: 120/66 121/72 108/69 113/70  Pulse: (!) 101 (!) 106 (!) 104 (!) 106  Resp: (!) 23 17 (!) 24 (!) 21  Temp:      TempSrc:      SpO2: 99% 98% 97% 96%  Weight:      Height:       Wt  Readings from Last 3 Encounters:  04/16/22 81.6 kg  06/19/14 82.1 kg   Body mass index is 30.9 kg/m.  General: Obese built, not in obvious distress HENT: Normocephalic, No scleral pallor or icterus noted. Oral mucosa is moist.  Chest:   No crackles or wheezes.    CVS: S1 &S2 heard. No murmur.  Regular rate and rhythm. Abdomen: Soft, nontender, nondistended.  Bowel sounds are heard. No abdominal mass palpated Extremities: No cyanosis, clubbing or edema.  Peripheral pulses are palpable. Psych: Alert, awake and oriented, normal mood CNS:  No cranial nerve deficits.  Power equal in all extremities.   Skin: Warm and dry.  No rashes noted.  Labs on Admission:   CBC: Recent Labs  Lab 04/16/22 1211  WBC 21.6*  HGB 15.0  HCT 45.2  MCV 93.0  PLT 590*    Basic Metabolic Panel: Recent Labs  Lab 04/16/22 1211  NA 134*  K 3.5  CL 99  CO2 26  GLUCOSE 126*  BUN 7  CREATININE 0.95  CALCIUM 8.8*  MG 2.2    Liver Function Tests: Recent Labs  Lab 04/16/22 1414  AST 11*  ALT 11  ALKPHOS 66  BILITOT 0.5  PROT 8.4*  ALBUMIN 3.5   No results for input(s): "LIPASE", "AMYLASE" in the last 168 hours. No results for input(s): "AMMONIA" in the last 168 hours.  Cardiac Enzymes: No results for input(s): "CKTOTAL", "CKMB", "CKMBINDEX", "TROPONINI" in the last 168 hours.  BNP (last 3 results) No results for input(s): "BNP" in the last 8760 hours.  ProBNP (last 3 results) No results for input(s): "PROBNP" in the last 8760 hours.  CBG: No results for input(s): "GLUCAP" in the last 168 hours.  Lipase     Component Value Date/Time   LIPASE 18 (L) 11/11/2014 1145     Urinalysis    Component Value Date/Time   COLORURINE AMBER (A) 04/16/2022 1500   APPEARANCEUR HAZY (A) 04/16/2022 1500   LABSPEC 1.024 04/16/2022 1500   PHURINE 5.0 04/16/2022 1500   GLUCOSEU NEGATIVE 04/16/2022 1500   HGBUR NEGATIVE 04/16/2022 1500   BILIRUBINUR NEGATIVE 04/16/2022 1500   KETONESUR  NEGATIVE 04/16/2022 1500   PROTEINUR 100 (A) 04/16/2022 1500   UROBILINOGEN 1.0 11/11/2014 1343   NITRITE NEGATIVE 04/16/2022 1500   LEUKOCYTESUR MODERATE (A) 04/16/2022 1500     Drugs of Abuse     Component Value Date/Time   LABOPIA NONE DETECTED 08/26/2015 2156   COCAINSCRNUR POSITIVE (A) 08/26/2015 2156   LABBENZ POSITIVE (A) 08/26/2015 2156   AMPHETMU POSITIVE (A) 08/26/2015 2156   THCU POSITIVE (A) 08/26/2015 2156   LABBARB NONE DETECTED 08/26/2015 2156      Radiological Exams on Admission: DG Chest 2 View  Result Date: 04/16/2022 CLINICAL DATA:  Chest pain since last night, worsening with the inspiration. EXAM: CHEST - 2 VIEW COMPARISON:  Portable examination earlier the  same date. No other relevant prior studies. FINDINGS: The heart size and mediastinal contours are stable. Persistent patchy airspace disease medially at the right lung base, superimposed over the thoracic spine on the lateral view. There is no obscuration of the right heart border or hemidiaphragm. The left lung appears clear. No pleural effusion or pneumothorax. The bones appear unremarkable. IMPRESSION: Persistent patchy airspace disease medially in the right lower lobe, suspicious for pneumonia or aspiration. Electronically Signed   By: Richardean Sale M.D.   On: 04/16/2022 12:50   DG Chest Port 1 View  Result Date: 04/16/2022 CLINICAL DATA:  Chest pain and shortness of breath, pain in the LEFT chest. EXAM: PORTABLE CHEST 1 VIEW COMPARISON:  None Available. FINDINGS: Kg leads project over the chest. Hazy opacity on the RIGHT along the RIGHT cardiac border and partially obscuring RIGHT hemidiaphragm. Cardiomediastinal contours and hilar structures are otherwise unremarkable. No pneumothorax. No gross pleural effusion. On limited assessment no acute skeletal process. IMPRESSION: Hazy opacity on the RIGHT along the RIGHT cardiac border and partially obscuring RIGHT heart border and RIGHT medial hemidiaphragm. Findings  are suspicious for pneumonia in the appropriate clinical setting. Would suggest PA and lateral chest radiograph for further evaluation. Would also suggest follow-up to ensure resolution and exclude the possibility of underlying mass or lesion. Electronically Signed   By: Zetta Bills M.D.   On: 04/16/2022 10:58    EKG: Personally reviewed by me which shows***   Consultant: None  Code Status: Full code  Microbiology blood culture sent from the ED  Antibiotics: Rocephin and Zithromax  Family Communication:  Patients' condition and plan of care including tests being ordered have been discussed with the patient and *** who indicate understanding and agree with the plan.   Status is: Observation The patient remains OBS appropriate and will d/c before 2 midnights.   Severity of Illness: The appropriate patient status for this patient is OBSERVATION. Observation status is judged to be reasonable and necessary in order to provide the required intensity of service to ensure the patient's safety. The patient's presenting symptoms, physical exam findings, and initial radiographic and laboratory data in the context of their medical condition is felt to place them at decreased risk for further clinical deterioration. Furthermore, it is anticipated that the patient will be medically stable for discharge from the hospital within 2 midnights of admission.   Signed, Flora Lipps, MD [facility] 04/16/2022

## 2022-04-17 LAB — URINE CULTURE

## 2022-04-21 LAB — CULTURE, BLOOD (ROUTINE X 2)
Culture: NO GROWTH
Culture: NO GROWTH
Special Requests: ADEQUATE
